# Patient Record
Sex: Female | Born: 1955 | Race: White | Hispanic: No | Marital: Married | Smoking: Former smoker
Health system: Southern US, Community
[De-identification: ages and names within clinical notes are randomized; demographics above are authoritative.]

## PROBLEM LIST (undated history)

## (undated) DIAGNOSIS — A630 Anogenital (venereal) warts: Secondary | ICD-10-CM

## (undated) DIAGNOSIS — K219 Gastro-esophageal reflux disease without esophagitis: Secondary | ICD-10-CM

## (undated) DIAGNOSIS — J449 Chronic obstructive pulmonary disease, unspecified: Secondary | ICD-10-CM

## (undated) DIAGNOSIS — H8109 Meniere's disease, unspecified ear: Secondary | ICD-10-CM

## (undated) DIAGNOSIS — M8580 Other specified disorders of bone density and structure, unspecified site: Secondary | ICD-10-CM

## (undated) DIAGNOSIS — E7800 Pure hypercholesterolemia, unspecified: Secondary | ICD-10-CM

## (undated) DIAGNOSIS — G4700 Insomnia, unspecified: Secondary | ICD-10-CM

## (undated) DIAGNOSIS — K449 Diaphragmatic hernia without obstruction or gangrene: Secondary | ICD-10-CM

## (undated) DIAGNOSIS — F909 Attention-deficit hyperactivity disorder, unspecified type: Secondary | ICD-10-CM

## (undated) DIAGNOSIS — M1990 Unspecified osteoarthritis, unspecified site: Secondary | ICD-10-CM

## (undated) DIAGNOSIS — J439 Emphysema, unspecified: Secondary | ICD-10-CM

## (undated) DIAGNOSIS — R000 Tachycardia, unspecified: Secondary | ICD-10-CM

## (undated) DIAGNOSIS — K2270 Barrett's esophagus without dysplasia: Secondary | ICD-10-CM

## (undated) DIAGNOSIS — H3530 Unspecified macular degeneration: Secondary | ICD-10-CM

## (undated) DIAGNOSIS — N900 Mild vulvar dysplasia: Secondary | ICD-10-CM

## (undated) DIAGNOSIS — D649 Anemia, unspecified: Secondary | ICD-10-CM

## (undated) DIAGNOSIS — R110 Nausea: Secondary | ICD-10-CM

## (undated) DIAGNOSIS — T7840XA Allergy, unspecified, initial encounter: Secondary | ICD-10-CM

## (undated) DIAGNOSIS — D071 Carcinoma in situ of vulva: Secondary | ICD-10-CM

## (undated) HISTORY — DX: Gastro-esophageal reflux disease without esophagitis: K21.9

## (undated) HISTORY — DX: Emphysema, unspecified: J43.9

## (undated) HISTORY — DX: Insomnia, unspecified: G47.00

## (undated) HISTORY — DX: Diaphragmatic hernia without obstruction or gangrene: K44.9

## (undated) HISTORY — DX: Mild vulvar dysplasia: N90.0

## (undated) HISTORY — DX: Meniere's disease, unspecified ear: H81.09

## (undated) HISTORY — DX: Unspecified macular degeneration: H35.30

## (undated) HISTORY — PX: TUBAL LIGATION: SHX77

## (undated) HISTORY — DX: Unspecified osteoarthritis, unspecified site: M19.90

## (undated) HISTORY — DX: Tachycardia, unspecified: R00.0

## (undated) HISTORY — DX: Carcinoma in situ of vulva: D07.1

## (undated) HISTORY — PX: VULVA SURGERY: SHX837

## (undated) HISTORY — PX: BREAST BIOPSY: SHX20

## (undated) HISTORY — PX: OTHER SURGICAL HISTORY: SHX169

## (undated) HISTORY — DX: Other specified disorders of bone density and structure, unspecified site: M85.80

## (undated) HISTORY — DX: Attention-deficit hyperactivity disorder, unspecified type: F90.9

## (undated) HISTORY — PX: UPPER GASTROINTESTINAL ENDOSCOPY: SHX188

## (undated) HISTORY — DX: Barrett's esophagus without dysplasia: K22.70

## (undated) HISTORY — DX: Chronic obstructive pulmonary disease, unspecified: J44.9

## (undated) HISTORY — DX: Pure hypercholesterolemia, unspecified: E78.00

## (undated) HISTORY — DX: Allergy, unspecified, initial encounter: T78.40XA

## (undated) HISTORY — DX: Anemia, unspecified: D64.9

## (undated) HISTORY — DX: Anogenital (venereal) warts: A63.0

---

## 1977-03-27 HISTORY — PX: BREAST EXCISIONAL BIOPSY: SUR124

## 1978-03-27 HISTORY — PX: BREAST EXCISIONAL BIOPSY: SUR124

## 1981-03-27 HISTORY — PX: BREAST EXCISIONAL BIOPSY: SUR124

## 1984-03-27 HISTORY — PX: BREAST EXCISIONAL BIOPSY: SUR124

## 1993-03-27 HISTORY — PX: TOTAL VAGINAL HYSTERECTOMY: SHX2548

## 1998-02-16 ENCOUNTER — Other Ambulatory Visit: Admission: RE | Admit: 1998-02-16 | Discharge: 1998-02-16 | Payer: Self-pay | Admitting: Gynecology

## 1999-02-24 ENCOUNTER — Other Ambulatory Visit: Admission: RE | Admit: 1999-02-24 | Discharge: 1999-02-24 | Payer: Self-pay | Admitting: Gynecology

## 1999-03-28 HISTORY — PX: FOOT SURGERY: SHX648

## 2000-03-05 ENCOUNTER — Other Ambulatory Visit: Admission: RE | Admit: 2000-03-05 | Discharge: 2000-03-05 | Payer: Self-pay | Admitting: Gynecology

## 2001-05-09 ENCOUNTER — Other Ambulatory Visit: Admission: RE | Admit: 2001-05-09 | Discharge: 2001-05-09 | Payer: Self-pay | Admitting: Gynecology

## 2002-05-30 ENCOUNTER — Other Ambulatory Visit: Admission: RE | Admit: 2002-05-30 | Discharge: 2002-05-30 | Payer: Self-pay | Admitting: Gynecology

## 2002-08-22 ENCOUNTER — Encounter (INDEPENDENT_AMBULATORY_CARE_PROVIDER_SITE_OTHER): Payer: Self-pay

## 2002-08-22 ENCOUNTER — Ambulatory Visit (HOSPITAL_COMMUNITY): Admission: RE | Admit: 2002-08-22 | Discharge: 2002-08-22 | Payer: Self-pay | Admitting: Gynecology

## 2003-08-17 ENCOUNTER — Other Ambulatory Visit: Admission: RE | Admit: 2003-08-17 | Discharge: 2003-08-17 | Payer: Self-pay | Admitting: Gynecology

## 2004-08-24 ENCOUNTER — Other Ambulatory Visit: Admission: RE | Admit: 2004-08-24 | Discharge: 2004-08-24 | Payer: Self-pay | Admitting: Gynecology

## 2004-09-02 ENCOUNTER — Ambulatory Visit (HOSPITAL_BASED_OUTPATIENT_CLINIC_OR_DEPARTMENT_OTHER): Admission: RE | Admit: 2004-09-02 | Discharge: 2004-09-02 | Payer: Self-pay | Admitting: Gynecology

## 2004-09-08 ENCOUNTER — Encounter: Admission: RE | Admit: 2004-09-08 | Discharge: 2004-12-07 | Payer: Self-pay | Admitting: Internal Medicine

## 2004-09-26 ENCOUNTER — Encounter: Admission: RE | Admit: 2004-09-26 | Discharge: 2004-09-26 | Payer: Self-pay | Admitting: Gynecology

## 2005-08-15 ENCOUNTER — Ambulatory Visit: Payer: Self-pay | Admitting: Internal Medicine

## 2005-09-11 ENCOUNTER — Other Ambulatory Visit: Admission: RE | Admit: 2005-09-11 | Discharge: 2005-09-11 | Payer: Self-pay | Admitting: Gynecology

## 2005-09-21 ENCOUNTER — Ambulatory Visit: Payer: Self-pay | Admitting: Internal Medicine

## 2005-10-04 ENCOUNTER — Encounter: Admission: RE | Admit: 2005-10-04 | Discharge: 2005-10-04 | Payer: Self-pay | Admitting: Gynecology

## 2005-11-01 ENCOUNTER — Ambulatory Visit (HOSPITAL_COMMUNITY): Admission: RE | Admit: 2005-11-01 | Discharge: 2005-11-02 | Payer: Self-pay | Admitting: Gastroenterology

## 2005-11-01 ENCOUNTER — Encounter (INDEPENDENT_AMBULATORY_CARE_PROVIDER_SITE_OTHER): Payer: Self-pay | Admitting: Specialist

## 2006-09-14 ENCOUNTER — Other Ambulatory Visit: Admission: RE | Admit: 2006-09-14 | Discharge: 2006-09-14 | Payer: Self-pay | Admitting: Gynecology

## 2006-10-08 ENCOUNTER — Encounter: Admission: RE | Admit: 2006-10-08 | Discharge: 2006-10-08 | Payer: Self-pay | Admitting: Gynecology

## 2007-09-23 ENCOUNTER — Other Ambulatory Visit: Admission: RE | Admit: 2007-09-23 | Discharge: 2007-09-23 | Payer: Self-pay | Admitting: Gynecology

## 2007-10-17 ENCOUNTER — Encounter: Admission: RE | Admit: 2007-10-17 | Discharge: 2007-10-17 | Payer: Self-pay | Admitting: Gynecology

## 2007-12-30 ENCOUNTER — Ambulatory Visit: Payer: Self-pay | Admitting: Gynecology

## 2008-05-05 ENCOUNTER — Ambulatory Visit: Payer: Self-pay | Admitting: Gynecology

## 2008-06-29 DIAGNOSIS — J438 Other emphysema: Secondary | ICD-10-CM

## 2008-06-29 DIAGNOSIS — R0602 Shortness of breath: Secondary | ICD-10-CM | POA: Insufficient documentation

## 2008-06-29 DIAGNOSIS — J449 Chronic obstructive pulmonary disease, unspecified: Secondary | ICD-10-CM

## 2008-06-30 ENCOUNTER — Ambulatory Visit: Payer: Self-pay | Admitting: Internal Medicine

## 2008-06-30 DIAGNOSIS — J310 Chronic rhinitis: Secondary | ICD-10-CM | POA: Insufficient documentation

## 2008-07-23 ENCOUNTER — Ambulatory Visit: Payer: Self-pay | Admitting: Internal Medicine

## 2008-08-06 ENCOUNTER — Ambulatory Visit: Payer: Self-pay | Admitting: Internal Medicine

## 2008-10-06 ENCOUNTER — Encounter: Payer: Self-pay | Admitting: Gynecology

## 2008-10-06 ENCOUNTER — Other Ambulatory Visit: Admission: RE | Admit: 2008-10-06 | Discharge: 2008-10-06 | Payer: Self-pay | Admitting: Gynecology

## 2008-10-06 ENCOUNTER — Ambulatory Visit: Payer: Self-pay | Admitting: Gynecology

## 2008-10-19 ENCOUNTER — Ambulatory Visit: Payer: Self-pay | Admitting: Gynecology

## 2008-10-19 ENCOUNTER — Encounter: Admission: RE | Admit: 2008-10-19 | Discharge: 2008-10-19 | Payer: Self-pay | Admitting: Gynecology

## 2009-02-04 ENCOUNTER — Ambulatory Visit: Payer: Self-pay | Admitting: Internal Medicine

## 2009-10-07 ENCOUNTER — Ambulatory Visit: Payer: Self-pay | Admitting: Gynecology

## 2009-10-07 ENCOUNTER — Other Ambulatory Visit: Admission: RE | Admit: 2009-10-07 | Discharge: 2009-10-07 | Payer: Self-pay | Admitting: Gynecology

## 2009-10-26 ENCOUNTER — Encounter: Admission: RE | Admit: 2009-10-26 | Discharge: 2009-10-26 | Payer: Self-pay | Admitting: Gynecology

## 2009-12-07 ENCOUNTER — Encounter: Payer: Self-pay | Admitting: Internal Medicine

## 2009-12-17 HISTORY — PX: CARDIOVASCULAR STRESS TEST: SHX262

## 2009-12-30 ENCOUNTER — Ambulatory Visit: Payer: Self-pay | Admitting: Gynecology

## 2010-02-22 ENCOUNTER — Ambulatory Visit: Payer: Self-pay | Admitting: Internal Medicine

## 2010-04-17 ENCOUNTER — Encounter: Payer: Self-pay | Admitting: Gynecology

## 2010-04-26 NOTE — Assessment & Plan Note (Signed)
Summary: 1 yr f/u ///kp   Visit Type:  Follow-up Primary Provider/Referring Provider:  Keturah Barre  CC:  1 year follow up. pt states breathing has been "fine". Pt c/o occas sob. pt denies any cough. Pt states overall she is fne. .  History of Present Illness: 08/06/08- COPD,,rhinitis Liked Ipratropium nasal spray. Easy fatigue only if bending or lifting. Still some sense that she can't get a comfortable deep breath at times. Denies chest pain except incomplete control of acid reflux on zantac plus kapidex. Throat clearing daily.  Reviewed PFT - Severe COPD by FEV1/FVC 0.49. - 9%, 97%, 100%, 652m CXR- NAD, apical emphysema. Discussed lack of benefit from prior trials of usual lung meds. Did get pneumovax- rash on arm.  February 04, 2009- COPD, rhinitis No worse but still occasionally aware of heavy chest dyspnea. Denies chest pain, palpitation, wheeze or phlegm. 8 years today since she quit smokiing. Throat clearing with acid reflux- Dexilant helps. had flu vax  February 22, 2010- COPD, rhinitis...............Marland Kitchenhusband here 1 year follow up. pt states breathing has been "fine". Pt c/o occas sob. pt denies any cough. Pt states overall she is fne.  Doing well. Really likes ipratropium nasal spray. Dyspnea with exertion hills and stairs and maybe weather.  CXR- We reviewed images. Apical emphysema, NAD.    Preventive Screening-Counseling & Management  Alcohol-Tobacco     Smoking Status: quit     Packs/Day: 1.5     Year Started: Age 51     Year Quit: 2001  Current Medications (verified): 1)  Evista 60 Mg Tabs (Raloxifene Hcl) .... Take 1 By Mouth Once Daily 2)  Vytorin 10-20 Mg Tabs (Ezetimibe-Simvastatin) .... Once Daily 3)  Caltrate 600+d Plus 600-400 Mg-Unit Tabs (Calcium Carbonate-Vit D-Min) .... Take 1 By Mouth Two Times A Day 4)  Magnesium Glycinate  Powd (Magnesium Glycinate) .... 400mg  Two Times A Day 5)  Vitamin D 54098 Unit Caps (Ergocalciferol) .... Take  Monthly 6)  Zyrtec Allergy 10 Mg Tabs (Cetirizine Hcl) .... Take As Directed 7)  Ipratropium Bromide 0.06 % Soln (Ipratropium Bromide) .Marland Kitchen.. 1-2 Sprays Each Nostril Up Yto Three Times A Day As Needed 8)  Vivelle-Dot 0.05 Mg/24hr Pttw (Estradiol) .Marland Kitchen.. 1 Patch Two Times A Week 9)  Dexilant 60 Mg Cpdr (Dexlansoprazole) .... Once Daily 10)  Vyvanse 70 Mg Caps (Lisdexamfetamine Dimesylate) .... Once Daily  Allergies (verified): 1)  ! Morphine  Past History:  Past Surgical History: Last updated: 07/20/2008 Breast bx x 4 Total Abdominal Hysterectomy Hammer toe  Family History: Last updated: 20-Jul-2008 Mother- died CVA@ 29 yo Brother- bullous emphysema  Social History: Last updated: 02/22/2010 Patient states former smoker. quit 2001. 1 1/2 ppd.  Married Astronomer at jail  Risk Factors: Smoking Status: quit (02/22/2010) Packs/Day: 1.5 (02/22/2010)  Past Medical History: C O P D Bronchitis Emphysema Allergic Rhinitis  Social History: Patient states former smoker. quit 2001. 1 1/2 ppd.  Married Astronomer at jail Packs/Day:  1.5  Review of Systems      See HPI       The patient complains of shortness of breath with activity and nasal congestion/difficulty breathing through nose.  The patient denies shortness of breath at rest, coughing up blood, chest pain, irregular heartbeats, acid heartburn, indigestion, loss of appetite, weight change, abdominal pain, difficulty swallowing, sore throat, tooth/dental problems, headaches, sneezing, itching, ear ache, hand/feet swelling, rash, change in color of mucus, and fever.    Vital Signs:  Patient profile:   55 year old female  Height:      66 inches Weight:      142 pounds BMI:     23.00 O2 Sat:      97 % on Room air Pulse rate:   74 / minute BP sitting:   118 / 80  (left arm) Cuff size:   regular  Vitals Entered By: Reynaldo Minium CMA (February 22, 2010 4:04 PM)  O2 Flow:  Room air CC: 1 year follow up. pt states breathing  has been "fine". Pt c/o occas sob. pt denies any cough. Pt states overall she is fne.  Comments meds and allergies updated Reynaldo Minium CMA  February 22, 2010 4:04 PM    Physical Exam  Additional Exam:  General: A/Ox3; pleasant and cooperative, NAD, SKIN: no rash, lesions NODES: no lymphadenopathy HEENT: [place]/AT, EOM- WNL, Conjuctivae- clear, PERRLA, TM-WNL, Nose- clear, Throat- clear and wnl, Mallampati II NECK: Supple w/ fair ROM, JVD- none, normal carotid impulses w/o bruits Thyroid- CHEST: Clear to P&A, quiet, unlabored HEART: RRR, no m/g/r heard ABDOMEN:  WJX:BJYN, nl pulses, no edema, cyanosis or clubbing NEURO: Grossly intact to observation      Impression & Recommendations:  Problem # 1:  C O P D (ICD-496) Stable and not very intrusive. There is little bonchitis so she doesn't notice cough, wheeze or phlegm. I encouraged maintenance of activity to sustain endurance.  We discussed aggravating factors, smoke avoidance, and the role of her rescue inhaler for occasional use.   Problem # 2:  RHINITIS (ICD-472.0) Her nasal spray has worked wll and been well tolerated.  Medications Added to Medication List This Visit: 1)  Vytorin 10-20 Mg Tabs (Ezetimibe-simvastatin) .... Once daily 2)  Dexilant 60 Mg Cpdr (Dexlansoprazole) .... Once daily 3)  Vyvanse 70 Mg Caps (Lisdexamfetamine dimesylate) .... Once daily  Other Orders: Est. Patient Level IV (82956)  Patient Instructions: 1)  Please schedule a follow-up appointment in 1 year. Call sooner if needed. 2)  Script for nasal spray refilled.  Prescriptions: IPRATROPIUM BROMIDE 0.06 % SOLN (IPRATROPIUM BROMIDE) 1-2 sprays each nostril up yto three times a day as needed  #1 x prn   Entered and Authorized by:   Waymon Budge MD   Signed by:   Waymon Budge MD on 02/22/2010   Method used:   Print then Give to Patient   RxID:   [id number]    Immunization History:  Influenza Immunization History:    Influenza:   historical (12/25/2009)

## 2010-04-26 NOTE — Letter (Signed)
Summary: [facility]   Imported By: Sherian Rein 12/31/2009 14:27:13  _____________________________________________________________________  External Attachment:    Type:   Image     Comment:   External Document

## 2010-08-12 NOTE — Op Note (Signed)
Cheyenne Gallegos, BURGOON                      ACCOUNT NO.:  [id number]   MEDICAL RECORD NO.:  [id number]                   PATIENT TYPE:  AMB   LOCATION:  SDC                                  FACILITY:  [facility]   PHYSICIAN:  Juan H. Lily Peer, M.D.             DATE OF BIRTH:  01-21-1956   DATE OF PROCEDURE:  DATE OF DISCHARGE:                                 OPERATIVE REPORT   PREOPERATIVE DIAGNOSIS:  VIN-III.   POSTOPERATIVE DIAGNOSIS:  VIN-III.   PROCEDURE:  1. Colposcopic evaluation of vagina and external genitalia.  2. Wide local excision of left labia majora VIN-III.  Excision of additional     vulvar lesions seen at the time of colposcopic evaluation in the left     upper labia majora.  Excision of vulvar lesion in right lower labia     majora. Excision of vulvar lesion right upper labia majora.   SURGEON:  Juan H. Lily Peer, M.D.   ANESTHESIA:  General endotracheal anesthesia.   INDICATIONS FOR PROCEDURE:  A 55 year old gravida 1, para 1 female who on  routine annual gynecological examination, was noted to have a fleshy lesion  noted in the lower aspect of the left labia majora which was biopsied and  the pathology report had demonstrated severe squamous dysplasia, VIN-III, no  invasion identified, margins involved are high dysplasia.  No other lesion  was noted in the vagina and the vaginal cuff at the time of colposcopic at  that point.   FINDINGS:  After acetic acid was placed in the vagina and external  genitalia, a thorough inspection was done with colposcope and acetowhite  leukoplakic like lesions were noted in addition to the previously biopsied  site from the left labia majora lower aspect, in the upper aspect of the  left labia majora as well as the opposite to the right labia majora upper  and lower __________ encasing lesions were noted to be leukoplakic lesions  as well which were excised as well.  The patient with previous hysterectomy  for dysplasia and  menorrhagia many years ago.  Follow-up Pap smears have  been normal.  The vaginal tube and vaginal cuff with no lesions seen.   DESCRIPTION OF PROCEDURE:  After the patient was adequately counseled, she  was taken to the operating room where she underwent a successful general  endotracheal anesthesia.  She was placed in the lithotomy position.  The  external genitalia and vagina were moistened with acetic acid and a thorough  inspection with a colposcope systematically commencing with the vaginal cuff  which was without any lesions of the remainder of the vaginal tube.  External genitalia was inspected thoroughly noting at this time that is was  evident that besides the area that was biopsied in the office in which the  pathology report came back severe dysplasia, she was noted to have, on the  upper aspect of the left labia majora a flat  leukoplakic lesion and adjacent  on the contralateral labia, there was kissing lesions that were seen on the  right upper labia majora and right labia majora.  The first portion of the  procedure consisted also of applying Lugol solution for adequate demarcation  of these lesions.  The skin was then cleansed and with a marking pen, the  areas were demarcated. The wide local excision was done on the left labia  majora lesion which was previously biopsied and returned back VIN-III with  margins involved so this wide local excision was to go at least 5 mm or  beyond the lesion for complete excision.  This was done with the scalpel and  properly labeled and was admitted for histologic evaluation.  The left labia  majora upper lesion had also been marked and was excised and passed off the  operative field and properly marked as well.  A contralateral labia majora  upper and lower pole lesions were excised with the scalpel as well and  labeled appropriately and submitted for histological evaluation.  These four  excision sites, the skin was reapproximated with  4-0 Vicryl suture, 0.25%  Marcaine was infiltrated for postoperative analgesia.  She received 30 mg of  Toradol en route to the recovery room and Silvadene cream was placed in the  area.  The patient tolerated the procedure well without any complications.  Blood loss was minimal.  Fluid resuscitation was 2200  mL of lactated  Ringers.                                               Juan H. Lily Peer, M.D.    JHF/MEDQ  D:  08/22/2002  T:  08/22/2002  Job:  [id number]

## 2010-08-12 NOTE — Op Note (Signed)
NAMESAMAR, VENNEMAN            ACCOUNT NO.:  [id number]   MEDICAL RECORD NO.:  [id number]          PATIENT TYPE:  AMB   LOCATION:  ENDO                         FACILITY:  [facility]   PHYSICIAN:  Anselmo Rod, M.D.  DATE OF BIRTH:  11-09-1955   DATE OF PROCEDURE:  11/01/2005  DATE OF DISCHARGE:                                 OPERATIVE REPORT   PROCEDURE:  Colonoscopy with cold biopsy x4.   ENDOSCOPIST:  Anselmo Rod, M.D.   INSTRUMENT USED:  Olympus video colonoscope.   INDICATIONS FOR PROCEDURE:  A 55 year old white female undergoing a  screening colonoscopy. The patient has a family history of rectal cancer in  a paternal grandmother, rule out colonic polyps, masses, etc.   PREPROCEDURE PREPARATION:  Informed consent was procured from the patient.  The patient fasted for 4 hours prior to the procedure and after being  prepped with 32 OsmoPrep pills the night of and the morning of the  procedure. The risks and benefits of the procedure including a 10% missed  rate of cancer and polyp were discussed with the patient as well.   PREPROCEDURE PHYSICAL:  The patient had stable vital signs. Neck supple.  Chest clear to auscultation. S1, S2 regular. Abdomen soft with normal bowel  sounds.   DESCRIPTION OF PROCEDURE:  The patient was placed in the left lateral  decubitus position and sedated with 100 mcg of fentanyl and 10 mg of Versed  in slow incremental doses. Once the patient was adequately sedated and  maintained on low flow oxygen and continuous cardiac monitoring, the Olympus  video colonoscope was advanced from the rectum to the cecum. Multiple  washings were done and the appendiceal orifice and ileocecal valve were  clearly visualized and photographed. The terminal ileum appeared healthy and  without lesions. Four small sessile polyps were biopsied from the rectum and  the rectosigmoid colon (cold biopsies x4). The rest of the exam was  unremarkable. Retroflexion  revealed no abnormalities.   IMPRESSION:  1.  Four small sessile polyps biopsied from the rectum and rectosigmoid      colon (two cold biopsies from the rectum and two cold biopsies from the      rectosigmoid colon).  2.  Otherwise normal appearing colon up to the terminal ileum.  3.  No evidence of diverticulosis.   RECOMMENDATIONS:  1.  Await pathology results.  2.  Repeat colonoscopy depending on pathology results.  3.  Avoid all nonsteroidals including aspirin for the next 2 weeks.  4.  Outpatient followup as the need arises in the future.      Anselmo Rod, M.D.  Electronically Signed     JNM/MEDQ  D:  11/01/2005  T:  11/01/2005  Job:  [id number]   cc:   Gaetano Hawthorne. Lily Peer, M.D.  Darlin Priestly, MD

## 2010-08-12 NOTE — Op Note (Signed)
NAMEPARRIE, Cheyenne Gallegos            ACCOUNT NO.:  [id number]   MEDICAL RECORD NO.:  [id number]          PATIENT TYPE:  AMB   LOCATION:  [facility]                         FACILITY:  [facility]   PHYSICIAN:  Juan H. 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 The  right fourchette biopsy had benign squamous mucosa with hyperkeratosis, her  recent PAP smear was normal.   SURGEON:  Juan H. Lily Peer, M.D.   PREOPERATIVE DIAGNOSIS:  VIN I.   POSTOPERATIVE DIAGNOSIS:  VIN I.   PROCEDURE PERFORMED:  CO2 laser ablation of left labia majora/fourchette.   FINDINGS:  As described above.   DESCRIPTION OF OPERATION:  After the patient was adequately counseled, she  was taken to the operating room where she underwent successful general  endotracheal anesthesia. The patient's external genitalia was  painted with  Lugol's solution. The colposcope was brought into view, the area of the left  labia majora, the inferior portion which had been biopsied before showed  slightly decreased uptake of the Lugol solution. The CO2 laser was set at 10  watts on the handheld piece and with a brush like technique, approximately  two millimeters in depth was ablated through the area of the inferior  portion of the left labia majora, the fourchette  and part of the inferior  portion of the right labia minora  in the event that this potentially could  contribute to kissing like lesions. Upon completion,  Silvadene cream was  applied to the area, the patient was extubated, transferred to recovery room  with stable vital signs. Blood loss was minimal, fluid resuscitation  consisted of 400 mL of lactated Ringer's.       JHF/MEDQ  D:  09/02/2004  T:  09/02/2004  Job:  [id number]

## 2010-08-12 NOTE — H&P (Signed)
[name], Cheyenne Gallegos                      ACCOUNT NO.:  [id number]   MEDICAL RECORD NO.:  [id number]                   PATIENT TYPE:  AMB   LOCATION:  SDC                                  FACILITY:  [facility]   PHYSICIAN:  Juan H. Lily Peer, M.D.             DATE OF BIRTH:  Oct 29, 1955   DATE OF ADMISSION:  08/22/2002  DATE OF DISCHARGE:                                HISTORY & PHYSICAL   CHIEF COMPLAINT:  Vulvar intraepithelial neoplasia III.   HISTORY OF PRESENT ILLNESS:  The patient is a 55 year old, gravida 1, para 1  who was seen in the office at [facility] on March 5 for her  annual gynecologic examination. At the time of her examination, there was an  area of the external genitalia near the area of the fourchette a fleshy-like  area and the patient was asked to return to the office for a colposcopic  evaluation. She did so on April 26 and detailed colposcopic evaluation was  performed and the area of the fourchette fleshy-like lesion was excised and  the pathology report demonstrated severe squamous dysplasia VIN III, no  invasion identified, margins involved are high dysplasia. No other lesion  was noted in the vagina or the vaginal cuff. She has had a previous history  of transvaginal hysterectomy in 1995 secondary to dysfunctional uterine  bleeding and dysplasia and her Pap smears have been normal since that time.  Her most Pap smear done here in our office on March 19 was normal. The  patient was scheduled to undergo a wide local excision of the fourchette VIN  III.   PAST MEDICAL HISTORY:  She has diagnosis of Meniere's' disease, she had an  MRI which apparent was negative two years ago. She had been seen by Dr.  Lenard Forth and had been under the care of Dr. Morning, ENT physician in  Hospital. She also has a history of gastroesophageal reflux. She has had a  breast biopsy in the past, benign, transvaginal hysterectomy, cervical cone  biopsy. She also had some  early emphysematous changes when she was a smoker  but apparently she has stopped smoking for several months now. 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   MEDICATIONS:  1. Excedrin migraine p.r.n.  2. Actonel 35 mg q. weekly.  3. Ambien 5 mg q.h.s. p.r.n. and bedtime.  4. Pepcid.  5. Recently Dr. Rene Kocher had seen the patient because of complaint of     restless leg and done an evaluation on her. He had recommended that she     discontinue the Excedrin. She also takes Topamax as a prophylactic agent     for headaches where she takes 25 mg at bedtime and increases by 25 mg per     week as needed up to 100 mg at bedtime. She takes Relafen 750 mg one  twice a day as needed for headaches.  6. Over the counter Prilosec.  7. Darvocet p.r.n..   PHYSICAL EXAMINATION:  VITAL SIGNS:  The patient weighs 148 pounds, 5 feet,  6 inches tall, blood pressure 110/78.  HEENT:  Unremarkable.  NECK:  Supple. Trachea midline. No carotid bruit, no thyromegaly.  LUNGS:  Clear to auscultation without rhonchi or wheezes.  HEART:  Regular rate and rhythm, no murmurs or gallops.  BREASTS:  Unremarkable.  ABDOMEN:  Soft, nontender without rebound or guarding.  PELVIC:  Bartholin, urethra, and Skene glands within normal limits. Vaginal  fourchette fleshy lesion as described as above. Cervix no other gross  lesions seen either colposcopically or grossly.  EXTREMITIES:  DTR 1+, negative clonus, no edema.   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 Is scheduled to undergo on May 28  at 7:30 a.m. at [facility] wide local excision of the vaginal  intraepithelial neoplasia. The risks, pros and cons of the operation were  discussed with the patient to include infection, bleeding, dyspareunia after  a wide local excision break down, infection were discussed. Also in the  event of  the findings on the wide local excision, if there is cancer on the  final pathology report of any worsening lesion then she will need to be  referred to her GYN oncology for possible consideration of a partial  vulvectomy. Will await the pathology report and discuss management course  after the wide local excision which not only could be diagnostic but  therapeutic as well. All of the above were discussed with the patient, all  questions were answered and will following accordingly.   PLAN:  The patient scheduled for wide local excision of vulvar lesion on  Friday, May 28 at 7:30 a.m. at [facility].                                               [place] H. Lily Peer, M.D.    JHF/MEDQ  D:  08/20/2002  T:  08/20/2002  Job:  [id number]

## 2010-08-12 NOTE — H&P (Signed)
NAMELILYIAN, QUAYLE            ACCOUNT NO.:  [id number]   MEDICAL RECORD NO.:  [id number]           PATIENT TYPE:   LOCATION:                                 FACILITY:   PHYSICIAN:  Juan H. Lily Peer, M.D.     DATE OF BIRTH:   DATE OF ADMISSION:  DATE OF DISCHARGE:                                HISTORY & PHYSICAL   The patient is scheduled for surgery Friday, June 9th at 7:30 a.m. at [facility].  Please have history and physical available.   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   HISTORY:  Cheyenne Gallegos is a 55 year old gravida 1, para 1 with a prior history of  total vaginal hysterectomy in 1995 for dysfunctional uterine bleeding.  She  also many years ago before she was a patient of our practice had had vulvar  condyloma which had been treated as an outpatient in another medical  community.  In 2004, the patient had VIN-3 of the left labia majora, for  which she underwent wide local excision, margins were free, and she was  followed closely thereafter.  On July 04, 2004 there was a noted area that  was suspicious and was rebiopsied under colposcopic evaluation,  demonstrating VIN-1 of the  left labia majora.  The right fourchette biopsy  was benign squamous mucosa with hyperkeratosis.  The patient was scheduled  to undergo a CO2 laser ablation of the left labia majora on September 02, 2004 at  [facility].   PAST MEDICAL HISTORY:  She denies any allergies.  She has had one normal  spontaneous vaginal delivery.  She had VIN-3 of the left labia majora in  April, 2004.  She has had past history of vulvar condyloma.  Her most recent  Pap smear in May of 2006 was negative.   MEDICATIONS:  Consist of calcium supplementation, multivitamins, Actonel  which she takes for osteopenia 35 mg q. weekly.   MEDICAL CONDITIONS:  Meniere's disease/vertigo and osteopenia.   PAST SURGICAL HISTORY:  Prior  surgeries have consisted of TVH in 1995 for  DUB, breast biopsies in 1979, 1983, 1980 and 1986, foot surgery in 2001 for  bone spur and bunions, laparoscopic tubal ligation and wide local excision  of left labia majora (VIN-3).   FAMILY HISTORY:  Significant for hypertension.  Brother with COPD and  emphysema.  A family history of osteoporosis, mother, grandmother and aunt,  and the patient's grandmother had rectal cancer.   PHYSICAL EXAMINATION:  GENERAL APPEARANCE:  The patient weighs 144 pounds,  height 5 feet 6 inches tall, blood pressure 122/68.  HEENT:  Unremarkable.  NECK:  Supple.  The trachea is midline.  No carotid bruits, no thyromegaly.  LUNGS:  Clear to auscultation without any rhonchi or wheezes.  HEART:  Regular rate and rhythm.  No murmurs or gallops.  BREASTS:  Both breasts were fully examined in a semisupine position.  They  were symmetrical in appearance.  No skin discoloration.  No nipple  inversion.  No palpable masses or tenderness.  No supraclavicular or  axillary lymphadenopathy.  ABDOMEN:  Soft, nontender without rebound or guarding.  PELVIC:  External genitalia as described above.  Vagina with no gross  lesions.  Vaginal cuff intact.  RECTAL:  Exam unremarkable.   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  The patient has continued to be followed  closely and recently had an area of the left labia majora which was  suspicious for recurrence, and the colposcopic-directed biopsy demonstrated  VIN-1.  The right fourchette biopsy was benign.  The patient is to undergo  CO2 laser ablation of the left labia majora as an outpatient procedure at  [facility].  The risks, benefits and pros and cons were  discussed with the patient.  All questions were answered and will follow  accordingly.   PLAN:  The patient was  scheduled for CO2 laser ablation of vulvar dysplasia  Friday, June 9th at 7:30 a.m. in [facility].  Please have  the history and physical available.       JHF/MEDQ  D:  09/01/2004  T:  09/01/2004  Job:  [id number]

## 2010-08-30 ENCOUNTER — Other Ambulatory Visit (HOSPITAL_COMMUNITY): Payer: Self-pay | Admitting: Gastroenterology

## 2010-08-30 DIAGNOSIS — R110 Nausea: Secondary | ICD-10-CM

## 2010-09-19 ENCOUNTER — Ambulatory Visit (HOSPITAL_COMMUNITY)
Admission: RE | Admit: 2010-09-19 | Discharge: 2010-09-19 | Disposition: A | Discharge status: Home / Self Care | Payer: Managed Care, Other (non HMO) | Source: Ambulatory Visit | Attending: Gastroenterology | Admitting: Gastroenterology

## 2010-09-19 ENCOUNTER — Encounter (HOSPITAL_COMMUNITY): Payer: Self-pay

## 2010-09-19 ENCOUNTER — Encounter (HOSPITAL_COMMUNITY)
Admission: RE | Admit: 2010-09-19 | Discharge: 2010-09-19 | Disposition: A | Discharge status: Home / Self Care | Payer: Managed Care, Other (non HMO) | Source: Ambulatory Visit | Attending: Gastroenterology | Admitting: Gastroenterology

## 2010-09-19 DIAGNOSIS — R1011 Right upper quadrant pain: Secondary | ICD-10-CM | POA: Insufficient documentation

## 2010-09-19 DIAGNOSIS — R0789 Other chest pain: Secondary | ICD-10-CM | POA: Insufficient documentation

## 2010-09-19 DIAGNOSIS — R110 Nausea: Secondary | ICD-10-CM | POA: Insufficient documentation

## 2010-09-19 HISTORY — DX: Nausea: R11.0

## 2010-09-19 MED ORDER — SINCALIDE 5 MCG IJ SOLR: 0.0200 ug/kg | Freq: Once | INTRAMUSCULAR | Status: DC

## 2010-09-19 MED ORDER — TECHNETIUM TC 99M MEBROFENIN IV KIT
5.4000 | PACK | Freq: Once | INTRAVENOUS | Status: AC | PRN
  Administered 2010-09-19: 5.4 via INTRAVENOUS

## 2010-10-10 ENCOUNTER — Encounter: Payer: Self-pay | Admitting: Gynecology

## 2010-10-11 ENCOUNTER — Other Ambulatory Visit: Payer: Self-pay | Admitting: Gynecology

## 2010-10-11 DIAGNOSIS — Z1231 Encounter for screening mammogram for malignant neoplasm of breast: Secondary | ICD-10-CM

## 2010-10-12 ENCOUNTER — Encounter: Payer: Self-pay | Admitting: Gynecology

## 2010-10-13 ENCOUNTER — Encounter (INDEPENDENT_AMBULATORY_CARE_PROVIDER_SITE_OTHER): Payer: Managed Care, Other (non HMO) | Admitting: Gynecology

## 2010-10-13 ENCOUNTER — Other Ambulatory Visit: Payer: Self-pay | Admitting: Gynecology

## 2010-10-13 ENCOUNTER — Other Ambulatory Visit (HOSPITAL_COMMUNITY)
Admission: RE | Admit: 2010-10-13 | Discharge: 2010-10-13 | Disposition: A | Discharge status: Home / Self Care | Payer: Managed Care, Other (non HMO) | Source: Ambulatory Visit | Attending: Gynecology | Admitting: Gynecology

## 2010-10-13 ENCOUNTER — Encounter: Payer: Self-pay | Admitting: Gynecology

## 2010-10-13 DIAGNOSIS — M899 Disorder of bone, unspecified: Secondary | ICD-10-CM

## 2010-10-13 DIAGNOSIS — E559 Vitamin D deficiency, unspecified: Secondary | ICD-10-CM

## 2010-10-13 DIAGNOSIS — Z01419 Encounter for gynecological examination (general) (routine) without abnormal findings: Secondary | ICD-10-CM

## 2010-10-13 DIAGNOSIS — Z1211 Encounter for screening for malignant neoplasm of colon: Secondary | ICD-10-CM

## 2010-10-13 DIAGNOSIS — Z124 Encounter for screening for malignant neoplasm of cervix: Secondary | ICD-10-CM | POA: Insufficient documentation

## 2010-10-13 DIAGNOSIS — Z13220 Encounter for screening for lipoid disorders: Secondary | ICD-10-CM

## 2010-10-13 DIAGNOSIS — Z79890 Hormone replacement therapy (postmenopausal): Secondary | ICD-10-CM

## 2010-10-13 DIAGNOSIS — E7800 Pure hypercholesterolemia, unspecified: Secondary | ICD-10-CM

## 2010-10-28 ENCOUNTER — Ambulatory Visit: Payer: Managed Care, Other (non HMO)

## 2010-11-01 ENCOUNTER — Other Ambulatory Visit: Payer: Self-pay | Admitting: *Deleted

## 2010-11-01 MED ORDER — ERGOCALCIFEROL 1.25 MG (50000 UT) PO CAPS: 50000.0000 [IU] | ORAL_CAPSULE | ORAL | Status: DC

## 2010-11-01 NOTE — Telephone Encounter (Signed)
PHARMACY FAXED OVER RX FOR REFILL. PLEASE SIGN ORIGINAL RX ON CHART AND I WILL FAX TO PHARMACY.

## 2010-11-02 ENCOUNTER — Ambulatory Visit
Admission: RE | Admit: 2010-11-02 | Discharge: 2010-11-02 | Disposition: A | Discharge status: Home / Self Care | Payer: Managed Care, Other (non HMO) | Source: Ambulatory Visit | Attending: Gynecology | Admitting: Gynecology

## 2010-11-02 DIAGNOSIS — Z1231 Encounter for screening mammogram for malignant neoplasm of breast: Secondary | ICD-10-CM

## 2010-11-02 NOTE — Telephone Encounter (Signed)
VITAMIN D2 CAP 5000 I/U FAXED TO [facility].

## 2010-11-03 ENCOUNTER — Other Ambulatory Visit: Payer: Self-pay | Admitting: *Deleted

## 2010-11-03 MED ORDER — ESTRADIOL 0.05 MG/24HR TD PTTW: 1.0000 | MEDICATED_PATCH | TRANSDERMAL | Status: DC

## 2010-11-03 NOTE — Telephone Encounter (Signed)
Faxed refill of Vivelle Dot to [facility] [phone]

## 2011-02-20 ENCOUNTER — Encounter: Payer: Self-pay | Admitting: Internal Medicine

## 2011-02-21 ENCOUNTER — Ambulatory Visit (INDEPENDENT_AMBULATORY_CARE_PROVIDER_SITE_OTHER): Payer: Managed Care, Other (non HMO) | Admitting: Internal Medicine

## 2011-02-21 ENCOUNTER — Encounter: Payer: Self-pay | Admitting: Internal Medicine

## 2011-02-21 VITALS — BP 114/64 | HR 66 | Ht 66.0 in | Wt 145.2 lb

## 2011-02-21 DIAGNOSIS — J449 Chronic obstructive pulmonary disease, unspecified: Secondary | ICD-10-CM

## 2011-02-21 DIAGNOSIS — J310 Chronic rhinitis: Secondary | ICD-10-CM

## 2011-02-21 MED ORDER — ALBUTEROL SULFATE HFA 108 (90 BASE) MCG/ACT IN AERS: 2.0000 | INHALATION_SPRAY | Freq: Four times a day (QID) | RESPIRATORY_TRACT | Status: DC | PRN

## 2011-02-21 MED ORDER — IPRATROPIUM BROMIDE 0.06 % NA SOLN: 2.0000 | Freq: Four times a day (QID) | NASAL | Status: DC

## 2011-02-21 NOTE — Patient Instructions (Addendum)
Sample Patanase for trial- antihistamine       1 or 2 puffs each nostril up to twice daily as needed.   Refill script sent for ipratropium nasal spray  Script printed for rescue inhaler Proair

## 2011-02-21 NOTE — Progress Notes (Signed)
02/21/11- 83 yoF former smoker followed for COPD, rhinitis LOV- 02/22/10 Husband here. Had flu shot. Since last here has had a few episodes of shortness of breath, felt as difficulty taking in her breath, without wheeze or cough. Husband's rescue inhaler has helped with the use. This feeling is more common in summer and fall but not positional. She occasionally wakes at night with throat tickle but this shortness of breath sensation is also seen in the daytime and that work. 4 rhinitis, ipratropium nasal spray has been good but it doesn't seem to be lasting as long as it did. Mostly she has bothersome rhinorrhea without a lot of sneeze or congestion. Today she feels "fine".  ROS-see HPI Constitutional:   No-   weight loss, night sweats, fevers, chills, fatigue, lassitude. HEENT:   No-  headaches, difficulty swallowing, tooth/dental problems, sore throat,       No-  sneezing, itching, ear ache, nasal congestion,+ post nasal drip,  CV:  No-   chest pain, orthopnea, PND, swelling in lower extremities, anasarca,                                  dizziness, palpitations Resp: Per HPI shortness of breath with exertion or at rest.              No-   productive cough,  No non-productive cough,  No- coughing up of blood.              No-   change in color of mucus.  No- wheezing.   Skin: No-   rash or lesions. GI:  No-   heartburn, indigestion, abdominal pain, nausea, vomiting, diarrhea,                 change in bowel habits, loss of appetite GU: No-   dysuria, change in color of urine, no urgency or frequency.  No- flank pain. MS:  No-   joint pain or swelling.  No- decreased range of motion.  No- back pain. Neuro-     nothing unusual Psych:  No- change in mood or affect. No depression or anxiety.  No memory loss.  OBJ General- Alert, Oriented, Affect-appropriate, Distress- none acute; well-appearing Skin- rash-none, lesions- none, excoriation- none Lymphadenopathy- none Head- atraumatic  Eyes- Gross vision intact, PERRLA, conjunctivae clear secretions            Ears- Hearing, canals-normal            Nose- Clear, no-Septal dev, mucus, polyps, erosion, perforation             Throat- Mallampati II , mucosa clear , drainage- none, tonsils- atrophic Neck- flexible , trachea midline, no stridor , thyroid nl, carotid no bruit Chest - symmetrical excursion , unlabored           Heart/CV- RRR , no murmur , no gallop  , no rub, nl s1 s2                           - JVD- none , edema- none, stasis changes- none, varices- none           Lung- clear to P&A, wheeze- none, cough- none , dullness-none, rub- none           Chest wall-  Abd- tender-no, distended-no, bowel sounds-present, HSM- no Br/ Gen/ Rectal- Not done, not indicated Extrem- cyanosis- none, clubbing,  none, atrophy- none, strength- nl Neuro- grossly intact to observation

## 2011-02-22 NOTE — Assessment & Plan Note (Signed)
She mainly complains of a watery rhinorrhea in summer and fall. Ipratropium isn't quite enough. Plan-try antihistamine nasal spray

## 2011-02-22 NOTE — Assessment & Plan Note (Signed)
Mild episodic dyspnea not particularly related to time of day or position but probably associated more with exertion. Since her husband's rescue inhaler has helped, we will let her have one of her own. Medication education was done.

## 2011-04-09 ENCOUNTER — Other Ambulatory Visit: Payer: Self-pay | Admitting: Gynecology

## 2011-09-30 ENCOUNTER — Encounter (HOSPITAL_COMMUNITY): Payer: Self-pay | Admitting: Emergency Medicine

## 2011-09-30 ENCOUNTER — Emergency Department (HOSPITAL_COMMUNITY)
Admission: EM | Admit: 2011-09-30 | Discharge: 2011-09-30 | Disposition: A | Discharge status: Home / Self Care | Payer: Managed Care, Other (non HMO) | Attending: Emergency Medicine | Admitting: Emergency Medicine

## 2011-09-30 ENCOUNTER — Emergency Department (HOSPITAL_COMMUNITY): Payer: Managed Care, Other (non HMO)

## 2011-09-30 DIAGNOSIS — R110 Nausea: Secondary | ICD-10-CM | POA: Insufficient documentation

## 2011-09-30 DIAGNOSIS — R630 Anorexia: Secondary | ICD-10-CM | POA: Insufficient documentation

## 2011-09-30 DIAGNOSIS — R5381 Other malaise: Secondary | ICD-10-CM | POA: Insufficient documentation

## 2011-09-30 DIAGNOSIS — E7800 Pure hypercholesterolemia, unspecified: Secondary | ICD-10-CM | POA: Insufficient documentation

## 2011-09-30 DIAGNOSIS — R197 Diarrhea, unspecified: Secondary | ICD-10-CM

## 2011-09-30 DIAGNOSIS — R12 Heartburn: Secondary | ICD-10-CM | POA: Insufficient documentation

## 2011-09-30 DIAGNOSIS — K219 Gastro-esophageal reflux disease without esophagitis: Secondary | ICD-10-CM | POA: Insufficient documentation

## 2011-09-30 DIAGNOSIS — IMO0001 Reserved for inherently not codable concepts without codable children: Secondary | ICD-10-CM | POA: Insufficient documentation

## 2011-09-30 DIAGNOSIS — R1032 Left lower quadrant pain: Secondary | ICD-10-CM | POA: Insufficient documentation

## 2011-09-30 DIAGNOSIS — R231 Pallor: Secondary | ICD-10-CM | POA: Insufficient documentation

## 2011-09-30 DIAGNOSIS — Z79899 Other long term (current) drug therapy: Secondary | ICD-10-CM | POA: Insufficient documentation

## 2011-09-30 DIAGNOSIS — K529 Noninfective gastroenteritis and colitis, unspecified: Secondary | ICD-10-CM

## 2011-09-30 DIAGNOSIS — K5289 Other specified noninfective gastroenteritis and colitis: Secondary | ICD-10-CM | POA: Insufficient documentation

## 2011-09-30 DIAGNOSIS — F909 Attention-deficit hyperactivity disorder, unspecified type: Secondary | ICD-10-CM | POA: Insufficient documentation

## 2011-09-30 DIAGNOSIS — K2270 Barrett's esophagus without dysplasia: Secondary | ICD-10-CM | POA: Insufficient documentation

## 2011-09-30 LAB — CBC WITH DIFFERENTIAL/PLATELET
Basophils Absolute: 0 10*3/uL (ref 0.0–0.1)
Eosinophils Relative: 0 % (ref 0–5)
HCT: 36.4 % (ref 36.0–46.0)
Hemoglobin: 12.7 g/dL (ref 12.0–15.0)
Lymphocytes Relative: 12 % (ref 12–46)
Monocytes Relative: 24 % — ABNORMAL HIGH (ref 3–12)
Neutro Abs: 2.4 10*3/uL (ref 1.7–7.7)
RBC: 4.13 MIL/uL (ref 3.87–5.11)
RDW: 12.1 % (ref 11.5–15.5)
WBC: 3.8 10*3/uL — ABNORMAL LOW (ref 4.0–10.5)

## 2011-09-30 LAB — COMPREHENSIVE METABOLIC PANEL
ALT: 21 U/L (ref 0–35)
Calcium: 8.5 mg/dL (ref 8.4–10.5)
Creatinine, Ser: 0.78 mg/dL (ref 0.50–1.10)
GFR calc Af Amer: >90 mL/min (ref >90)
Glucose, Bld: 112 mg/dL — ABNORMAL HIGH (ref 70–99)
Sodium: 129 mEq/L — ABNORMAL LOW (ref 135–145)
Total Protein: 6.7 g/dL (ref 6.0–8.3)

## 2011-09-30 MED ORDER — OXYCODONE-ACETAMINOPHEN 5-325 MG PO TABS: 1.0000 | ORAL_TABLET | ORAL | Status: AC | PRN

## 2011-09-30 MED ORDER — SODIUM CHLORIDE 0.9 % IV BOLUS (SEPSIS)
1000.0000 mL | Freq: Once | INTRAVENOUS | Status: AC
  Administered 2011-09-30: 1000 mL via INTRAVENOUS

## 2011-09-30 MED ORDER — METRONIDAZOLE 500 MG PO TABS: 500.0000 mg | ORAL_TABLET | Freq: Two times a day (BID) | ORAL | Status: AC

## 2011-09-30 MED ORDER — HYDROMORPHONE HCL PF 1 MG/ML IJ SOLN
1.0000 mg | Freq: Once | INTRAMUSCULAR | Status: AC
  Administered 2011-09-30: 1 mg via INTRAVENOUS
  Filled 2011-09-30: qty 1

## 2011-09-30 MED ORDER — IOHEXOL 300 MG/ML  SOLN
100.0000 mL | Freq: Once | INTRAMUSCULAR | Status: AC | PRN
  Administered 2011-09-30: 100 mL via INTRAVENOUS

## 2011-09-30 MED ORDER — IOHEXOL 300 MG/ML  SOLN
20.0000 mL | INTRAMUSCULAR | Status: DC
  Administered 2011-09-30: 20 mL via ORAL

## 2011-09-30 MED ORDER — CIPROFLOXACIN HCL 500 MG PO TABS: 500.0000 mg | ORAL_TABLET | Freq: Two times a day (BID) | ORAL | Status: AC

## 2011-09-30 MED ORDER — ONDANSETRON HCL 4 MG PO TABS: 4.0000 mg | ORAL_TABLET | Freq: Four times a day (QID) | ORAL | Status: AC | PRN

## 2011-09-30 NOTE — ED Provider Notes (Signed)
I saw and evaluated the patient, reviewed the resident's note and I agree with the findings and plan.   Lyanne Co, MD 09/30/11 [phone]

## 2011-09-30 NOTE — ED Provider Notes (Signed)
History     CSN: [id number]  Arrival date & time 09/30/11  1450   First MD Initiated Contact with Patient 09/30/11 1832      Chief Complaint  Patient presents with  . Diarrhea  . Abdominal Pain  . Nausea    (Consider location/radiation/quality/duration/timing/severity/associated sxs/prior treatment) Patient is a 56 y.o. female presenting with abdominal pain. The history is provided by the patient.  Abdominal Pain The primary symptoms of the illness include abdominal pain, fatigue, nausea and diarrhea. The primary symptoms of the illness do not include fever, shortness of breath, vomiting or dysuria. The current episode started more than 2 days ago. The onset of the illness was gradual. The problem has been gradually worsening.  The abdominal pain is located in the LLQ. The abdominal pain does not radiate. Pain scale: moderate.  The diarrhea began 3 to 5 days ago. The diarrhea is watery. The diarrhea occurs more than 10 times per day.  The patient states that she believes she is currently not pregnant. The patient has had a change in bowel habit. Additional symptoms associated with the illness include anorexia and heartburn. Symptoms associated with the illness do not include chills, constipation, frequency or back pain. Significant associated medical issues include GERD (Barrett's esophagus).    Past Medical History  Diagnosis Date  . Nausea   . Normal spontaneous vaginal delivery   . VIN III (vulvar intraepithelial neoplasia III)   . Condyloma     vulvar  . 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 Osteopenia   . GERD (gastroesophageal reflux disease)   . Barretts esophagus   . Insomnia   . ADD (attention deficit disorder with hyperactivity)   . Vitamin d deficiency   . Hypercholesteremia     Past Surgical History  Procedure Date  . Total vaginal hysterectomy 1995  . Foot surgery 2001  . Tubal ligation   . Vulva surgery   . Co2 laser of labia   .  Breast biopsy [phone]    Family History  Problem Relation Age of Onset  . Hypertension Mother   . Diabetes Mother   . Heart disease Mother   . COPD Brother   . Colon cancer Paternal Grandmother     History  Substance Use Topics  . Smoking status: Former Smoker -- 1.5 packs/day    Types: Cigarettes    Quit date: 03/28/1999  . Smokeless tobacco: Not on file  . Alcohol Use: Yes    OB History    Grav Para Term Preterm Abortions TAB SAB Ect Mult Living                  Review of Systems  Constitutional: Positive for fatigue. Negative for fever and chills.  Respiratory: Negative for cough and shortness of breath.   Cardiovascular: Negative for chest pain and palpitations.  Gastrointestinal: Positive for heartburn, nausea, abdominal pain, diarrhea and anorexia. Negative for vomiting, constipation and blood in stool.  Genitourinary: Negative for dysuria and frequency.  Musculoskeletal: Positive for myalgias. Negative for back pain.  Skin: Positive for pallor. Negative for color change and rash.  Neurological: Negative for light-headedness and headaches.  All other systems reviewed and are negative.    Allergies  Morphine  Home Medications   Current Outpatient Rx  Name Route Sig Dispense Refill  . ZYRTEC PO Oral Take 1 tablet by mouth daily.     . DEXLANSOPRAZOLE 60 MG PO CPDR Oral Take 60  mg by mouth daily.      . ERGOCALCIFEROL 50000 UNITS PO CAPS Oral Take 50,000 Units by mouth every 30 (thirty) days. Due 10-04-11    . ESTRADIOL 0.05 MG/24HR TD PTTW Transdermal Place 1 patch (0.05 mg total) onto the skin 2 (two) times a week. 24 patch 4  . HYDROCODONE-ACETAMINOPHEN 5-500 MG PO TABS Oral Take 1 tablet by mouth every 4 (four) hours as needed. For pain    . IPRATROPIUM BROMIDE 0.06 % NA SOLN Nasal Place 2 sprays into the nose 4 (four) times daily. 1-2 sprays in each nostril daily as needed 15 mL prn  . LISDEXAMFETAMINE DIMESYLATE 70 MG PO CAPS Oral Take 70 mg by mouth  every morning.      Marland Kitchen LOPERAMIDE HCL 2 MG PO CAPS Oral Take 2 mg by mouth every 2 (two) hours while awake. diarrhea    . ONDANSETRON HCL 4 MG PO TABS Oral Take 4 mg by mouth every 8 (eight) hours as needed. nausea    . RALOXIFENE HCL 60 MG PO TABS Oral Take 60 mg by mouth daily.      Marland Kitchen ZOLPIDEM TARTRATE 5 MG PO TABS Oral Take 5 mg by mouth at bedtime as needed. sleep      BP 99/65  Pulse 87  Temp 99.2 F (37.3 C) (Oral)  Resp 20  SpO2 97%  Physical Exam  Nursing note and vitals reviewed. Constitutional: She is oriented to person, place, and time. She appears well-developed and well-nourished.  HENT:  Head: Normocephalic and atraumatic.  Eyes: Pupils are equal, round, and reactive to light.  Cardiovascular: Normal rate, regular rhythm, normal heart sounds and intact distal pulses.   Pulmonary/Chest: Effort normal and breath sounds normal. No respiratory distress.  Abdominal: Soft. Bowel sounds are normal. She exhibits no distension. There is tenderness (moderate, LLQ). There is no rebound.  Neurological: She is alert and oriented to person, place, and time.  Skin: Skin is warm and dry.  Psychiatric: She has a normal mood and affect.    ED Course  Procedures (including critical care time)  Labs Reviewed  CBC WITH DIFFERENTIAL - Abnormal; Notable for the following:    WBC 3.8 (*)     Monocytes Relative 24 (*)     Lymphs Abs 0.5 (*)     All other components within normal limits  COMPREHENSIVE METABOLIC PANEL - Abnormal; Notable for the following:    Sodium 129 (*)     Potassium 3.2 (*)     Chloride 91 (*)     Glucose, Bld 112 (*)     Albumin 3.1 (*)     All other components within normal limits  URINALYSIS, ROUTINE W REFLEX MICROSCOPIC   Ct Abdomen Pelvis W Contrast  09/30/2011  *RADIOLOGY REPORT*  Clinical Data: Abdominal pain.  Diarrhea.  CT ABDOMEN AND PELVIS WITH CONTRAST  Technique:  Multidetector CT imaging of the abdomen and pelvis was performed following the standard  protocol during bolus administration of intravenous contrast.  Contrast: OMNIPAQUE IOHEXOL 300 MG/ML  SOLN  Comparison: Radiography 09/28/2011  Findings: There is atelectasis or scarring in the right middle lobe and in the right lower lobe.  No pleural or pericardial fluid.  The liver has a normal appearance without focal lesions or biliary ductal dilatation. Minimal focal fat adjacent to the falciform ligament.  No calcified gallstones.  The spleen is normal.  The pancreas is normal.  The adrenal glands are normal.  The kidneys are normal.  The aorta shows atherosclerotic change but no aneurysm.  The IVC is normal.  There is free intraperitoneal fluid.  No intraperitoneal air is identified.  There is colonic wall thickening affecting the entire colon.  Small bowel appears normal.  No bony abnormality.  IMPRESSION: Diffuse colonic wall thickening consistent with colitis.  Small amount of free intraperitoneal fluid, presumably reactive.  Micro perforation cannot be excluded, but there is no evidence of free air or free contrast. The colitis could be infectious or indicate inflammatory bowel disease.  Original Report Authenticated By: Thomasenia Sales, M.D.     1. Colitis   2. Diarrhea       MDM  This is a 56 year old female who presents here today with left lower quadrant abdominal pain and diarrhea from her PCPs office. The patient states that on 6:30 he began using Essaic tonic which is an "herbal deep cleanser." She states that on 7/2 in the evening she began having some loose stools, so stopped the use of the tonic on the morning of 7/3. She states that since then she had worsening diarrhea greater than 20 bowel movements a day, without any improvement by using Imodium. She has also had progressive development of left lower artery abdominal pain over this period of time. She says she has been drinking significant amounts of earache to rehydrate herself, however this has irritated her reflux and  Barrett esophagus, making her slightly nauseous. She does endorse low grade fevers at home up to 100. She was seen at an outside hospital on the evening of 7/4, where lab work and an x-ray were done, she was told to use Imodium at this point in time. She followed up with her regular doctor today due to worsening pain, and her description it sounds like she had some rebound on exam in her left lower artery, which prompted her physician sent her here for further evaluation. At this point in time she does have moderate tenderness to the left lower quadrant, but no signs of rebound or guarding at this point in time. We'll check basic labs, as well as a CT scan to evaluate for possible colitis given her pain and persistent diarrhea, as well as other intra-abdominal pathology.  The patient's CT scan does show evidence of colitis. Her labs do show signs of mild dehydration. The patient does feel better after fluids. Discussed with the patient the findings on the CT scan, the treatment, followup with her regular doctor, in GI followup once this has resolved her possible colonoscopy, as well as indications to return to the emergency department. The patient and her husband express understanding.  Theotis Burrow, MD 09/30/11 [phone]

## 2011-09-30 NOTE — ED Notes (Signed)
Pt reports started taking over the counter Essiac Tonic last Saturday. By Tuesday pt began to experience loose stools by Wednesday night pt had diarrhea. Pt seen at [facility] on Thursday night for same. But not feeling any better. Pt seen by PMD today and had lab work done. PMD sent pt here for further eval due to pt have abdominal pain with tenderness to left lower quadrant and pale skin. Pt also reports nausea.

## 2011-09-30 NOTE — ED Notes (Signed)
Pt not in room.

## 2011-10-09 ENCOUNTER — Other Ambulatory Visit: Payer: Self-pay | Admitting: Gynecology

## 2011-10-09 DIAGNOSIS — Z1231 Encounter for screening mammogram for malignant neoplasm of breast: Secondary | ICD-10-CM

## 2011-10-16 ENCOUNTER — Encounter: Payer: Self-pay | Admitting: Gynecology

## 2011-10-16 ENCOUNTER — Other Ambulatory Visit: Payer: Self-pay | Admitting: Gynecology

## 2011-10-16 ENCOUNTER — Other Ambulatory Visit: Payer: Self-pay | Admitting: *Deleted

## 2011-10-16 ENCOUNTER — Encounter: Payer: Self-pay | Admitting: *Deleted

## 2011-10-16 ENCOUNTER — Other Ambulatory Visit (HOSPITAL_COMMUNITY)
Admission: RE | Admit: 2011-10-16 | Discharge: 2011-10-16 | Disposition: A | Discharge status: Home / Self Care | Payer: Managed Care, Other (non HMO) | Source: Ambulatory Visit | Attending: Gynecology | Admitting: Gynecology

## 2011-10-16 ENCOUNTER — Ambulatory Visit (INDEPENDENT_AMBULATORY_CARE_PROVIDER_SITE_OTHER): Payer: Managed Care, Other (non HMO) | Admitting: Gynecology

## 2011-10-16 VITALS — BP 104/66 | Ht 66.25 in | Wt 136.0 lb

## 2011-10-16 DIAGNOSIS — Z01419 Encounter for gynecological examination (general) (routine) without abnormal findings: Secondary | ICD-10-CM

## 2011-10-16 DIAGNOSIS — N63 Unspecified lump in unspecified breast: Secondary | ICD-10-CM

## 2011-10-16 DIAGNOSIS — Z1151 Encounter for screening for human papillomavirus (HPV): Secondary | ICD-10-CM | POA: Insufficient documentation

## 2011-10-16 DIAGNOSIS — N900 Mild vulvar dysplasia: Secondary | ICD-10-CM | POA: Insufficient documentation

## 2011-10-16 DIAGNOSIS — Z780 Asymptomatic menopausal state: Secondary | ICD-10-CM | POA: Insufficient documentation

## 2011-10-16 DIAGNOSIS — E7800 Pure hypercholesterolemia, unspecified: Secondary | ICD-10-CM | POA: Insufficient documentation

## 2011-10-16 DIAGNOSIS — M8580 Other specified disorders of bone density and structure, unspecified site: Secondary | ICD-10-CM | POA: Insufficient documentation

## 2011-10-16 DIAGNOSIS — D071 Carcinoma in situ of vulva: Secondary | ICD-10-CM | POA: Insufficient documentation

## 2011-10-16 DIAGNOSIS — I10 Essential (primary) hypertension: Secondary | ICD-10-CM

## 2011-10-16 DIAGNOSIS — K2270 Barrett's esophagus without dysplasia: Secondary | ICD-10-CM | POA: Insufficient documentation

## 2011-10-16 DIAGNOSIS — Z8639 Personal history of other endocrine, nutritional and metabolic disease: Secondary | ICD-10-CM

## 2011-10-16 DIAGNOSIS — Z87898 Personal history of other specified conditions: Secondary | ICD-10-CM

## 2011-10-16 DIAGNOSIS — M899 Disorder of bone, unspecified: Secondary | ICD-10-CM

## 2011-10-16 DIAGNOSIS — N898 Other specified noninflammatory disorders of vagina: Secondary | ICD-10-CM

## 2011-10-16 DIAGNOSIS — R634 Abnormal weight loss: Secondary | ICD-10-CM

## 2011-10-16 LAB — COMPREHENSIVE METABOLIC PANEL
Albumin: 3.8 g/dL (ref 3.5–5.2)
BUN: 9 mg/dL (ref 6–23)
CO2: 27 mEq/L (ref 19–32)
Calcium: 9 mg/dL (ref 8.4–10.5)
Chloride: 106 mEq/L (ref 96–112)
Glucose, Bld: 80 mg/dL (ref 70–99)
Potassium: 4 mEq/L (ref 3.5–5.3)
Sodium: 139 mEq/L (ref 135–145)
Total Protein: 6 g/dL (ref 6.0–8.3)

## 2011-10-16 LAB — CBC WITH DIFFERENTIAL/PLATELET
Basophils Relative: 1 % (ref 0–1)
HCT: 36.3 % (ref 36.0–46.0)
Hemoglobin: 11.6 g/dL — ABNORMAL LOW (ref 12.0–15.0)
Lymphocytes Relative: 32 % (ref 12–46)
Lymphs Abs: 1.3 10*3/uL (ref 0.7–4.0)
MCHC: 32 g/dL (ref 30.0–36.0)
Monocytes Absolute: 0.4 10*3/uL (ref 0.1–1.0)
Monocytes Relative: 9 % (ref 3–12)
Neutro Abs: 2.2 10*3/uL (ref 1.7–7.7)
RBC: 3.86 MIL/uL — ABNORMAL LOW (ref 3.87–5.11)

## 2011-10-16 LAB — LIPID PANEL: Cholesterol: 157 mg/dL (ref 0–200)

## 2011-10-16 LAB — TSH: TSH: 1.225 u[IU]/mL (ref 0.350–4.500)

## 2011-10-16 MED ORDER — RALOXIFENE HCL 60 MG PO TABS: 60.0000 mg | ORAL_TABLET | Freq: Every day | ORAL | Status: DC

## 2011-10-16 MED ORDER — ESTRADIOL 0.05 MG/24HR TD PTTW: 1.0000 | MEDICATED_PATCH | TRANSDERMAL | Status: DC

## 2011-10-16 MED ORDER — ERGOCALCIFEROL 1.25 MG (50000 UT) PO CAPS: 50000.0000 [IU] | ORAL_CAPSULE | ORAL | Status: DC

## 2011-10-16 MED ORDER — ZOLPIDEM TARTRATE 5 MG PO TABS: 5.0000 mg | ORAL_TABLET | Freq: Every evening | ORAL | Status: DC | PRN

## 2011-10-16 NOTE — Patient Instructions (Signed)

## 2011-10-16 NOTE — Progress Notes (Signed)
Cheyenne Gallegos October 14, 1955 [id number]   History:    56 y.o.  for annual gyn exam with multiple complaints today. She recently noticed a lump on her breast and was also concern of a possible lesion on her vulva. Patient last mammogram was in 2012 which was normal. She has had several breast biopsies on the right breast in 1979, 1980, 1983 and 1986 all benign. Her last colonoscopy was in 2008. Review of her record indicated she had severe dysplasia and LEEP cervical conization several years ago. In 1995 she had a transvaginal hysterectomy for dysfunction uterine bleeding and for dysplasia. 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 She has had history of Barrett's esophagitis and has been followed by Dr. Loreta Ave gastroenterologist. She's also suffer time for insomnia.  Past medical history,surgical history, family history and social history were all reviewed and documented in the EPIC chart.  Gynecologic History No LMP recorded. Patient has had a hysterectomy. Contraception: Hysterectomy Last Pap: 2012. Results were: normal Last mammogram: 2012. Results were: normal  Obstetric History OB History    Grav Para Term Preterm Abortions TAB SAB Ect Mult Living   1 1 1       1      # Outc Date GA Lbr Len/2nd Wgt Sex Del Anes PTL Lv   1 TRM     M SVD  No Yes       ROS: A ROS was performed and pertinent positives and negatives are included in the history.  GENERAL: No fevers or chills. HEENT: No change in vision, no earache, sore throat or sinus congestion. NECK: No pain or stiffness. CARDIOVASCULAR: No chest pain or pressure. No palpitations. PULMONARY: No shortness of breath, cough or wheeze. GASTROINTESTINAL: No abdominal pain, nausea, vomiting or diarrhea, melena or bright red blood per  rectum. GENITOURINARY: No urinary frequency, urgency, hesitancy or dysuria. MUSCULOSKELETAL: No joint or muscle pain, no back pain, no recent trauma. DERMATOLOGIC: No rash, no itching, no lesions. ENDOCRINE: No polyuria, polydipsia, no heat or cold intolerance. No recent change in weight. HEMATOLOGICAL: No anemia or easy bruising or bleeding. NEUROLOGIC: No headache, seizures, numbness, tingling or weakness. PSYCHIATRIC: No depression, no loss of interest in normal activity or change in sleep pattern.     Exam: chaperone present  BP 104/66  Ht 5' 6.25" (1.683 m)  Wt 136 lb (61.689 kg)  BMI 21.79 kg/m2  Body mass index is 21.79 kg/(m^2).  General appearance : Well developed well nourished female. No acute distress HEENT: Neck supple, trachea midline, no carotid bruits, no thyroidmegaly Lungs: Clear to auscultation, no rhonchi or wheezes, or rib retractions  Heart: Regular rate and rhythm, no murmurs or gallops  Breast: Right breast 10:00 position periareolar region there were 2 lesions measuring approximately 1 cm in size and tender. There was no supraclavicular axillary lymphadenopathy on either breast. Contralateral breast no palpable masses or tenderness.    Abdomen: no palpable masses or tenderness, no rebound or guarding Extremities: no edema or skin discoloration or tenderness  Pelvic:  Bartholin, Urethra, Skene Glands: Within normal limits             Vagina: No gross lesions or discharge  Cervix: Absent Uterus  absent  Adnexa  Without masses or tenderness  Anus and  perineum  normal   Rectovaginal  normal sphincter tone without palpated masses or tenderness             Hemoccult cards provided     Assessment/Plan:  56 y.o. female for annual exam was seen in the hospital recently for colitis. She has recovered already. She did lose some weight. Patient has had in the past history vitamin D deficiency for which she takes vitamin D.2 50,000 units q. monthly. For vasomotor  symptoms she has been on Vivelle-Dot 0.05 patch twice a week. For her osteopenia she takes Evista 60 mg daily. For insomnia she takes Ambien 10 mg by mouth when necessary. The following labs were drawn today: CBC, fasting lipid profile, TSH, vitamin D, and comprehensive metabolic panel. New Pap smear screening guidelines discussed. Since patient has had history of severe cervical dysplasia in the past and vulvar dysplasia she will continue to receive annual Pap smears. Patient will be referred to the radiologist for diagnostic mammogram of the right breast mass that patient recently discovered and was confirmed on exam today. Patient also will continue to followup with her gastroenterologist for her Barrett's esophagitis. She was reminded also to followup with her internist who will have access to her labs drawn today. She was reminded importance of calcium as well as weightbearing exercises for osteoporosis prevention. She will also scheduled for overdue bone density study.    Ok Edwards MD, 10:32 AM 10/16/2011

## 2011-10-17 ENCOUNTER — Other Ambulatory Visit: Payer: Self-pay | Admitting: *Deleted

## 2011-10-17 ENCOUNTER — Encounter: Payer: Self-pay | Admitting: *Deleted

## 2011-10-17 DIAGNOSIS — D071 Carcinoma in situ of vulva: Secondary | ICD-10-CM

## 2011-10-17 LAB — URINALYSIS W MICROSCOPIC + REFLEX CULTURE
Bilirubin Urine: NEGATIVE
Crystals: NONE SEEN
Glucose, UA: NEGATIVE mg/dL
Leukocytes, UA: NEGATIVE
Protein, ur: NEGATIVE mg/dL
Specific Gravity, Urine: 1.015 (ref 1.005–1.030)
Urobilinogen, UA: 0.2 mg/dL (ref 0.0–1.0)

## 2011-10-17 LAB — VITAMIN D 25 HYDROXY (VIT D DEFICIENCY, FRACTURES): Vit D, 25-Hydroxy: 27 ng/mL — ABNORMAL LOW (ref 30–89)

## 2011-10-17 MED ORDER — RALOXIFENE HCL 60 MG PO TABS: 60.0000 mg | ORAL_TABLET | Freq: Every day | ORAL | Status: DC

## 2011-10-17 MED ORDER — RALOXIFENE HCL 60 MG PO TABS: 60.0000 mg | ORAL_TABLET | Freq: Every day | ORAL | Status: AC

## 2011-10-17 NOTE — Progress Notes (Signed)
Patient ID: Cheyenne Gallegos, female   DOB: December 01, 1955, 56 y.o.   MRN: [id number] Spoke with casey at [facility] center regarding pt appointment for diag. Mammogram due to 2 small nodules in periareolar area 10 o'clock right breast. They will contact pt and set appointment.

## 2011-10-19 NOTE — Progress Notes (Signed)
Patient ID: Cheyenne Gallegos, female   DOB: 07/21/1955, 56 y.o.   MRN: [id number] Breast center appointment for diag. Mammogram made on 10/24/11

## 2011-10-24 ENCOUNTER — Ambulatory Visit
Admission: RE | Admit: 2011-10-24 | Discharge: 2011-10-24 | Disposition: A | Discharge status: Home / Self Care | Payer: Managed Care, Other (non HMO) | Source: Ambulatory Visit | Attending: Gynecology | Admitting: Gynecology

## 2011-10-24 ENCOUNTER — Other Ambulatory Visit: Payer: Self-pay | Admitting: Gynecology

## 2011-10-24 DIAGNOSIS — N63 Unspecified lump in unspecified breast: Secondary | ICD-10-CM

## 2011-10-24 DIAGNOSIS — Z780 Asymptomatic menopausal state: Secondary | ICD-10-CM

## 2011-11-07 ENCOUNTER — Ambulatory Visit
Admission: RE | Admit: 2011-11-07 | Discharge: 2011-11-07 | Disposition: A | Discharge status: Home / Self Care | Payer: Managed Care, Other (non HMO) | Source: Ambulatory Visit | Attending: Gynecology | Admitting: Gynecology

## 2011-11-07 DIAGNOSIS — Z1231 Encounter for screening mammogram for malignant neoplasm of breast: Secondary | ICD-10-CM

## 2011-12-18 ENCOUNTER — Telehealth: Payer: Self-pay | Admitting: *Deleted

## 2011-12-18 DIAGNOSIS — E569 Vitamin deficiency, unspecified: Secondary | ICD-10-CM

## 2011-12-18 DIAGNOSIS — D071 Carcinoma in situ of vulva: Secondary | ICD-10-CM

## 2011-12-18 NOTE — Telephone Encounter (Signed)
Pt informed with the below note she had her vit. D level drawn at her pcp doctor and will fax results over tomorrow.

## 2011-12-18 NOTE — Telephone Encounter (Signed)
Pt was told to continue to take vit d 50,000 units q monthly. Pt asked if you would like her to take vit. D with calcium as well? She was taking vit. D with calcium before but you told her to stop and only take the 50,000 units. See paper chart.  Please advise

## 2011-12-18 NOTE — Telephone Encounter (Signed)
Patient is to continue to take her vitamin D 50,000 units q. monthly. She should only take calcium daily in the range of 1200-1500 mg daily in a single tablet not in a combination with vitamin D because she does not need more than the vitamin D monthly I prescribed

## 2011-12-19 NOTE — Telephone Encounter (Signed)
Pt faxed over recent Vit D level results.

## 2011-12-19 NOTE — Telephone Encounter (Signed)
Please call patient and tell her that I saw her vitamin D level results of 22.7 (normal 30-100). I noticed she was taking vitamin D 50,000 units monthly. Please call in 50,000 units q. weekly for the next 12 weeks and then come by the office to check her vitamin D level either here or at [facility]. Tell her to stop taking the monthly dose. Once she finishes the 12 weeks I would like her to take vitamin D 3 2000 units every day instead of the monthly dose.

## 2011-12-20 MED ORDER — ESTRADIOL 0.05 MG/24HR TD PTTW: 1.0000 | MEDICATED_PATCH | TRANSDERMAL | Status: DC

## 2011-12-20 MED ORDER — RALOXIFENE HCL 60 MG PO TABS: 60.0000 mg | ORAL_TABLET | Freq: Every day | ORAL | Status: DC

## 2011-12-20 MED ORDER — VITAMIN D (ERGOCALCIFEROL) 1.25 MG (50000 UNIT) PO CAPS: 50000.0000 [IU] | ORAL_CAPSULE | ORAL | Status: DC

## 2011-12-20 NOTE — Telephone Encounter (Signed)
Pt informed with the below note, rx sent to pharmacy. 

## 2012-01-18 ENCOUNTER — Telehealth: Payer: Self-pay | Admitting: *Deleted

## 2012-01-18 MED ORDER — ESTRADIOL 0.05 MG/24HR TD PTTW: 1.0000 | MEDICATED_PATCH | TRANSDERMAL | Status: DC

## 2012-01-18 NOTE — Telephone Encounter (Signed)
Pt called requesting her vivelle dot 0.05mg  patch sent to [facility], rx sent with refill til annual

## 2012-02-05 ENCOUNTER — Telehealth: Payer: Self-pay | Admitting: *Deleted

## 2012-02-05 MED ORDER — ESTRADIOL 0.05 MG/24HR TD PTTW: 1.0000 | MEDICATED_PATCH | TRANSDERMAL | Status: DC

## 2012-02-05 NOTE — Telephone Encounter (Signed)
Pt called requesting refill on vivelle dot patch 0.5 mg. rx sent.

## 2012-02-20 ENCOUNTER — Ambulatory Visit (INDEPENDENT_AMBULATORY_CARE_PROVIDER_SITE_OTHER)
Admission: RE | Admit: 2012-02-20 | Discharge: 2012-02-20 | Disposition: A | Discharge status: Home / Self Care | Payer: Managed Care, Other (non HMO) | Source: Ambulatory Visit | Attending: Internal Medicine | Admitting: Internal Medicine

## 2012-02-20 ENCOUNTER — Other Ambulatory Visit: Payer: Managed Care, Other (non HMO)

## 2012-02-20 ENCOUNTER — Encounter: Payer: Self-pay | Admitting: Internal Medicine

## 2012-02-20 ENCOUNTER — Ambulatory Visit (INDEPENDENT_AMBULATORY_CARE_PROVIDER_SITE_OTHER): Payer: Managed Care, Other (non HMO) | Admitting: Internal Medicine

## 2012-02-20 VITALS — BP 104/60 | HR 84 | Ht 66.0 in | Wt 134.6 lb

## 2012-02-20 DIAGNOSIS — J449 Chronic obstructive pulmonary disease, unspecified: Secondary | ICD-10-CM

## 2012-02-20 DIAGNOSIS — K2270 Barrett's esophagus without dysplasia: Secondary | ICD-10-CM

## 2012-02-20 DIAGNOSIS — J310 Chronic rhinitis: Secondary | ICD-10-CM

## 2012-02-20 MED ORDER — IPRATROPIUM BROMIDE 0.06 % NA SOLN: 2.0000 | Freq: Four times a day (QID) | NASAL | Status: DC

## 2012-02-20 NOTE — Progress Notes (Signed)
02/21/11- 57 yoF former smoker followed for COPD, rhinitis LOV- 02/22/10 Husband here. Had flu shot. Since last here has had a few episodes of shortness of breath, felt as difficulty taking in her breath, without wheeze or cough. Husband's rescue inhaler has helped with the use. This feeling is more common in summer and fall but not positional. She occasionally wakes at night with throat tickle but this shortness of breath sensation is also seen in the daytime and that work. 4 rhinitis, ipratropium nasal spray has been good but it doesn't seem to be lasting as long as it did. Mostly she has bothersome rhinorrhea without a lot of sneeze or congestion. Today she feels "fine".  02/20/12- 72 yoF former smoker followed for COPD, rhinitis complicated by hx GERD/ Barrett FOLLOWS FOR: no more than usual SOB, denies any wheezing, cough, or congestion at this time Had flu vax. Hx red arm local reaction to pneumovax in past. Denies breathing problems- no cough or wheeze. Has symptomatic reflux on Dexilant- known GERD/ Barrett's- Dr Chales [facility] [place]. Brother [name] sees Dr Delford Field here for bullous emphysema.   ROS-see HPI Constitutional:   No-   weight loss, night sweats, fevers, chills, fatigue, lassitude. HEENT:   No-  headaches, difficulty swallowing, tooth/dental problems, sore throat,       No-  sneezing, itching, ear ache, nasal congestion,+ post nasal drip,  CV:  No-   chest pain, orthopnea, PND, swelling in lower extremities, anasarca, dizziness, palpitations Resp: Per HPI- shortness of breath with exertion or at rest.              No-   productive cough,  No non-productive cough,  No- coughing up of blood.              No-   change in color of mucus.  No- wheezing.   Skin: No-   rash or lesions. GI:  + heartburn, indigestion, No-abdominal pain, nausea, vomiting,  GU:  MS:  No-   joint pain or swelling.   Neuro-     nothing unusual Psych:  No- change in mood or affect. No depression or  anxiety.  No memory loss.  OBJ General- Alert, Oriented, Affect-appropriate, Distress- none acute; well-appearing Skin- rash-none, lesions- none, excoriation- none Lymphadenopathy- none Head- atraumatic            Eyes- Gross vision intact, PERRLA, conjunctivae clear secretions            Ears- Hearing, canals-normal            Nose- Clear, no-Septal dev, mucus, polyps, erosion, perforation             Throat- Mallampati II , mucosa clear , drainage- none, tonsils- atrophic Neck- flexible , trachea midline, no stridor , thyroid nl, carotid no bruit Chest - symmetrical excursion , unlabored           Heart/CV- RRR , no murmur , no gallop  , no rub, nl s1 s2                           - JVD- none , edema- none, stasis changes- none, varices- none           Lung- clear to P&A, wheeze- none, cough- none , dullness-none, rub- none           Chest wall-  Abd-  Br/ Gen/ Rectal- Not done, not indicated Extrem- cyanosis- none, clubbing, none, atrophy- none,  strength- nl Neuro- grossly intact to observation

## 2012-02-20 NOTE — Patient Instructions (Addendum)
Order- lab- test for a1AT deficiency  Order- CXR  Dx COPD  Order- future - schedule PFT for next visit

## 2012-02-23 ENCOUNTER — Telehealth: Payer: Self-pay | Admitting: Internal Medicine

## 2012-02-23 NOTE — Progress Notes (Signed)
Quick Note:  Pt aware of results. ______ 

## 2012-02-23 NOTE — Telephone Encounter (Signed)
Called and spoke with patient, informed her of results/recs as listed below per Dr. Maple Hudson.  Patient Verbalized understanding and nothing further needed at this time.  Notes Recorded by Waymon Budge, MD on 02/20/2012 at 7:54 PM CXR- mild emphysema changes, no other concerns.

## 2012-02-28 LAB — ALPHA-1 ANTITRYPSIN PHENOTYPE: A-1 Antitrypsin: 148 mg/dL (ref 83–199)

## 2012-03-04 NOTE — Assessment & Plan Note (Signed)
Significant GERD despite dexilant. She is strongly advised to contact her GI office for early reassessment.

## 2012-03-04 NOTE — Assessment & Plan Note (Signed)
Ipratropium nasal spray has worked well to control watery rhinorhea when needed. Plan- refill.

## 2012-03-04 NOTE — Assessment & Plan Note (Addendum)
Brother with bullous emphysema. She is currently doing well, but time to update CXR. Doing well without bronchodilators. Plan- CXR, PFT

## 2012-03-06 NOTE — Progress Notes (Signed)
Quick Note:  Spoke with pt and notified of results per Dr. Young. Pt verbalized understanding and denied any questions.  ______ 

## 2012-06-11 ENCOUNTER — Other Ambulatory Visit: Payer: Self-pay | Admitting: Internal Medicine

## 2012-10-08 ENCOUNTER — Other Ambulatory Visit: Payer: Self-pay

## 2012-10-08 DIAGNOSIS — Z1231 Encounter for screening mammogram for malignant neoplasm of breast: Secondary | ICD-10-CM

## 2012-10-17 ENCOUNTER — Encounter: Payer: Self-pay | Admitting: Gynecology

## 2012-10-17 ENCOUNTER — Other Ambulatory Visit (HOSPITAL_COMMUNITY)
Admission: RE | Admit: 2012-10-17 | Discharge: 2012-10-17 | Disposition: A | Discharge status: Home / Self Care | Payer: Managed Care, Other (non HMO) | Source: Ambulatory Visit | Attending: Gynecology | Admitting: Gynecology

## 2012-10-17 ENCOUNTER — Ambulatory Visit (INDEPENDENT_AMBULATORY_CARE_PROVIDER_SITE_OTHER): Payer: Managed Care, Other (non HMO) | Admitting: Gynecology

## 2012-10-17 VITALS — BP 120/78 | Ht 65.75 in | Wt 135.0 lb

## 2012-10-17 DIAGNOSIS — M8580 Other specified disorders of bone density and structure, unspecified site: Secondary | ICD-10-CM

## 2012-10-17 DIAGNOSIS — Z01419 Encounter for gynecological examination (general) (routine) without abnormal findings: Secondary | ICD-10-CM

## 2012-10-17 DIAGNOSIS — N951 Menopausal and female climacteric states: Secondary | ICD-10-CM

## 2012-10-17 DIAGNOSIS — Z1159 Encounter for screening for other viral diseases: Secondary | ICD-10-CM

## 2012-10-17 DIAGNOSIS — Z23 Encounter for immunization: Secondary | ICD-10-CM

## 2012-10-17 DIAGNOSIS — M949 Disorder of cartilage, unspecified: Secondary | ICD-10-CM

## 2012-10-17 DIAGNOSIS — Z8639 Personal history of other endocrine, nutritional and metabolic disease: Secondary | ICD-10-CM

## 2012-10-17 DIAGNOSIS — Z1151 Encounter for screening for human papillomavirus (HPV): Secondary | ICD-10-CM | POA: Insufficient documentation

## 2012-10-17 DIAGNOSIS — D071 Carcinoma in situ of vulva: Secondary | ICD-10-CM

## 2012-10-17 DIAGNOSIS — Z79890 Hormone replacement therapy (postmenopausal): Secondary | ICD-10-CM

## 2012-10-17 DIAGNOSIS — Z780 Asymptomatic menopausal state: Secondary | ICD-10-CM

## 2012-10-17 LAB — LIPID PANEL
Cholesterol: 157 mg/dL (ref 0–200)
Triglycerides: 110 mg/dL (ref <150)
VLDL: 22 mg/dL (ref 0–40)

## 2012-10-17 LAB — CBC WITH DIFFERENTIAL/PLATELET
Basophils Absolute: 0 10*3/uL (ref 0.0–0.1)
Basophils Relative: 0 % (ref 0–1)
HCT: 39.1 % (ref 36.0–46.0)
Lymphocytes Relative: 33 % (ref 12–46)
MCHC: 33.5 g/dL (ref 30.0–36.0)
Monocytes Absolute: 0.3 10*3/uL (ref 0.1–1.0)
Neutro Abs: 2.3 10*3/uL (ref 1.7–7.7)
Neutrophils Relative %: 57 % (ref 43–77)
Platelets: 250 10*3/uL (ref 150–400)
RDW: 13.5 % (ref 11.5–15.5)
WBC: 4 10*3/uL (ref 4.0–10.5)

## 2012-10-17 LAB — COMPREHENSIVE METABOLIC PANEL
CO2: 28 mEq/L (ref 19–32)
Calcium: 9 mg/dL (ref 8.4–10.5)
Chloride: 104 mEq/L (ref 96–112)
Glucose, Bld: 86 mg/dL (ref 70–99)
Sodium: 140 mEq/L (ref 135–145)
Total Bilirubin: 0.4 mg/dL (ref 0.3–1.2)
Total Protein: 6.4 g/dL (ref 6.0–8.3)

## 2012-10-17 MED ORDER — RALOXIFENE HCL 60 MG PO TABS: 60.0000 mg | ORAL_TABLET | Freq: Every day | ORAL | Status: DC

## 2012-10-17 NOTE — Patient Instructions (Addendum)

## 2012-10-17 NOTE — Progress Notes (Addendum)
Cheyenne Gallegos [birth year] [id number]   History:    57 y.o.  for annual gyn exam  With complaints of vasomotor symptoms despite being on Vivelle-Dot 0.05 mg patch twice a week. Patient visited a compound pharmacy   and had blood work drawn with the following results: Free testosterone less than 0.2 Progesterone 0.2 DHEA 62.8 Estradiol 11.2  The PharmD had recommended the patient to go on by identical hormone replacement with the following combination and to DC the Vivelle-Dot:  Dissolving a buccal troche to be dissolved between the gum line and cheek half twice daily. The troche would contain: Bi-est ( 50% estriol: 50% estradiol) 1.5 mg, progesterone 150 mg, testosterone 1.5 mg, and DHEA 2 mg  Patient wanted my opinion on the biochemical drug. Patient has stopped smoking approximately 11 years ago. Patient for a few years had been on Actonel because of osteopenia and has been switched to  Evista 60 mg daily. Patient has had several breast biopsies in 1979, 1980, 1983 and 1986 all benign. Her last colonoscopy was in 2008. Review of her record indicated she had severe dysplasia and LEEP cervical conization several years ago. In 1995 she had a transvaginal hysterectomy for dysfunction uterine bleeding and for dysplasia. 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 She has had history of Barrett's esophagitis and has been followed by Dr. Loreta Ave gastroenterologist.  Patient last bone density study in 2011 with stable bone marrow density in the osteopenic (decrease bone mineralization) patient is taking her calcium and vitamin D.  The patient is on Zocor for hyperlipidemia. Her primary physician is Dr. Sherral Hammers.  Patient with far history of colon polyps at age 25. Patient had colonoscopy  2013   Past medical history,surgical history, family history and social history were all reviewed and documented in the EPIC chart.  Gynecologic History No LMP recorded. Patient has had a hysterectomy. Contraception: status post hysterectomy Last Pap: 2013. Results were: normal Last mammogram: 2013. Results were: normal but dense breast  Obstetric History OB History   Grav Para Term Preterm Abortions TAB SAB Ect Mult Living   1 1 1       1      # Outc Date GA Lbr Len/2nd Wgt Sex Del Anes PTL Lv   1 TRM     M SVD  No Yes       ROS: A ROS was performed and pertinent positives and negatives are included in the history.  GENERAL: No fevers or chills. HEENT: No change in vision, no earache, sore throat or sinus congestion. NECK: No pain or stiffness. CARDIOVASCULAR: No chest pain or pressure. No palpitations. PULMONARY: No shortness of breath, cough or wheeze. GASTROINTESTINAL: No abdominal pain, nausea, vomiting or diarrhea, melena or bright red blood per rectum. GENITOURINARY: No urinary frequency, urgency, hesitancy or dysuria. MUSCULOSKELETAL: No joint or muscle pain, no back pain, no recent trauma. DERMATOLOGIC: No rash, no itching, no lesions. ENDOCRINE: No polyuria, polydipsia, no heat or cold intolerance. No recent change in weight. HEMATOLOGICAL: No anemia or easy bruising or bleeding. NEUROLOGIC: No headache, seizures, numbness, tingling or weakness. PSYCHIATRIC: No depression, no loss of interest in normal activity or change in sleep pattern.     Exam: chaperone present  BP 120/78  Ht 5' 5.75" (  1.67 m)  Wt 135 lb (61.236 kg)  BMI 21.96 kg/m2  Body mass index is 21.96 kg/(m^2).  General appearance : Well developed well nourished female. No acute distress HEENT: Neck supple, trachea midline, no carotid bruits, no thyroidmegaly Lungs: Clear to auscultation, no rhonchi or wheezes, or rib retractions  Heart: Regular rate and rhythm, no murmurs or gallops Breast:Examined in  sitting and supine position were symmetrical in appearance, no palpable masses or tenderness,  no skin retraction, no nipple inversion, no nipple discharge, no skin discoloration, no axillary or supraclavicular lymphadenopathy Abdomen: no palpable masses or tenderness, no rebound or guarding Extremities: no edema or skin discoloration or tenderness  Pelvic:  Bartholin, Urethra, Skene Glands: Within normal limits             Vagina: No gross lesions or discharge  Cervix: absent  Uterus Absent  Adnexa  Without masses or tenderness  Anus and perineum  normal   Rectovaginal  normal sphincter tone without palpated masses or tenderness             Hemoccult Records provided     Assessment/Plan:  57 y.o. female who is doing well with her Vivelle-Dot and now complaining of vasomotor symptoms. Patient would like to start the bio identical hormone. The risks benefits and pros and cons were discussed. We discussed once again the women's health initiative study. Patient would like to take it for a few months and then returned back to the office for followup. Patient fully except risk . I discussed with her the regulatory testing  of these compounding medications may be limited and unreliable.  Also discussed were the concerned that I had to combination progesterone testosterone estrogen which could increase her risk of breast cancer. She has had several breast biopsies in the past been benign. Her mammogram was very dense last year so she will get a 3-D this year. She was given a prescription Per her request for the following:   Dissolving a buccal troche to be dissolved between the gum line and cheek half twice daily. The troche would contain: Bi-est ( 50% estriol: 50% estradiol) 1.5 mg, progesterone 150 mg, testosterone 1.5 mg, and DHEA 2 mg  Patient's fasting today and will allow to be drawn: CBC, fasting lipid profile, comprehensive metabolic panel, urinalysis, TSH, and Pap smear. Patient was counseled  and received a Tdap vaccine today. Patient will schedule her bone density study. Hemoccult cards provided her to submit to the office for testing.  New CDC guidelines is recommending patients be tested once in her lifetime for hepatitis C antibody who were born between [birth year] through 1965. This was discussed with the patient today and has agreed to be tested today.        Ok Edwards MD, 10:02 AM 10/17/2012

## 2012-10-18 LAB — HEPATITIS C ANTIBODY: HCV Ab: NEGATIVE

## 2012-10-18 LAB — URINALYSIS W MICROSCOPIC + REFLEX CULTURE
Glucose, UA: NEGATIVE mg/dL
Hgb urine dipstick: NEGATIVE
Nitrite: NEGATIVE
Protein, ur: NEGATIVE mg/dL
Squamous Epithelial / LPF: NONE SEEN

## 2012-10-18 LAB — VITAMIN D 25 HYDROXY (VIT D DEFICIENCY, FRACTURES): Vit D, 25-Hydroxy: 44 ng/mL (ref 30–89)

## 2012-10-21 ENCOUNTER — Other Ambulatory Visit: Payer: Self-pay | Admitting: Gynecology

## 2012-10-21 LAB — URINE CULTURE: Colony Count: 15000

## 2012-10-21 MED ORDER — SULFAMETHOXAZOLE-TMP DS 800-160 MG PO TABS: 1.0000 | ORAL_TABLET | Freq: Two times a day (BID) | ORAL | Status: DC

## 2012-10-22 ENCOUNTER — Encounter: Payer: Self-pay | Admitting: Gynecology

## 2012-11-11 ENCOUNTER — Ambulatory Visit
Admission: RE | Admit: 2012-11-11 | Discharge: 2012-11-11 | Disposition: A | Discharge status: Home / Self Care | Payer: Managed Care, Other (non HMO) | Source: Ambulatory Visit

## 2012-11-11 DIAGNOSIS — Z1231 Encounter for screening mammogram for malignant neoplasm of breast: Secondary | ICD-10-CM

## 2012-11-14 ENCOUNTER — Other Ambulatory Visit: Payer: Self-pay | Admitting: Gynecology

## 2012-11-14 DIAGNOSIS — R928 Other abnormal and inconclusive findings on diagnostic imaging of breast: Secondary | ICD-10-CM

## 2012-12-02 ENCOUNTER — Ambulatory Visit
Admission: RE | Admit: 2012-12-02 | Discharge: 2012-12-02 | Disposition: A | Discharge status: Home / Self Care | Payer: Managed Care, Other (non HMO) | Source: Ambulatory Visit | Attending: Gynecology | Admitting: Gynecology

## 2012-12-02 DIAGNOSIS — R928 Other abnormal and inconclusive findings on diagnostic imaging of breast: Secondary | ICD-10-CM

## 2012-12-04 ENCOUNTER — Other Ambulatory Visit: Payer: Managed Care, Other (non HMO)

## 2012-12-10 ENCOUNTER — Ambulatory Visit (INDEPENDENT_AMBULATORY_CARE_PROVIDER_SITE_OTHER): Payer: Managed Care, Other (non HMO)

## 2012-12-10 DIAGNOSIS — Z780 Asymptomatic menopausal state: Secondary | ICD-10-CM

## 2012-12-10 DIAGNOSIS — Z8639 Personal history of other endocrine, nutritional and metabolic disease: Secondary | ICD-10-CM

## 2012-12-10 DIAGNOSIS — M8580 Other specified disorders of bone density and structure, unspecified site: Secondary | ICD-10-CM

## 2012-12-10 DIAGNOSIS — M899 Disorder of bone, unspecified: Secondary | ICD-10-CM

## 2013-01-30 ENCOUNTER — Other Ambulatory Visit: Payer: Self-pay

## 2013-02-18 ENCOUNTER — Ambulatory Visit (INDEPENDENT_AMBULATORY_CARE_PROVIDER_SITE_OTHER): Payer: Managed Care, Other (non HMO) | Admitting: Internal Medicine

## 2013-02-18 ENCOUNTER — Encounter: Payer: Self-pay | Admitting: Internal Medicine

## 2013-02-18 ENCOUNTER — Ambulatory Visit (INDEPENDENT_AMBULATORY_CARE_PROVIDER_SITE_OTHER): Payer: Managed Care, Other (non HMO) | Admitting: Cardiovascular Disease

## 2013-02-18 ENCOUNTER — Encounter: Payer: Self-pay | Admitting: Cardiovascular Disease

## 2013-02-18 VITALS — BP 120/72 | HR 77 | Ht 66.0 in | Wt 143.6 lb

## 2013-02-18 VITALS — BP 114/60 | HR 72 | Ht 66.0 in | Wt 141.5 lb

## 2013-02-18 DIAGNOSIS — J449 Chronic obstructive pulmonary disease, unspecified: Secondary | ICD-10-CM

## 2013-02-18 DIAGNOSIS — E7800 Pure hypercholesterolemia, unspecified: Secondary | ICD-10-CM

## 2013-02-18 DIAGNOSIS — R0602 Shortness of breath: Secondary | ICD-10-CM

## 2013-02-18 DIAGNOSIS — Z23 Encounter for immunization: Secondary | ICD-10-CM

## 2013-02-18 DIAGNOSIS — R002 Palpitations: Secondary | ICD-10-CM

## 2013-02-18 DIAGNOSIS — J310 Chronic rhinitis: Secondary | ICD-10-CM

## 2013-02-18 DIAGNOSIS — J4489 Other specified chronic obstructive pulmonary disease: Secondary | ICD-10-CM

## 2013-02-18 LAB — PULMONARY FUNCTION TEST
DL/VA % pred: 63 %
DLCO unc % pred: 70 %
DLCO unc: 19.07 ml/min/mmHg
FEF 25-75 Post: 1.05 L/sec
FEF2575-%Change-Post: 1 %
FEF2575-%Pred-Pre: 40 %
FEV1-%Change-Post: 1 %
FEV1-%Pred-Post: 82 %
FEV1-%Pred-Pre: 80 %
FEV1-Post: 2.32 L
FEV1FVC-%Change-Post: -2 %
FEV6-%Pred-Post: 113 %
FEV6-%Pred-Pre: 109 %
FEV6-Pre: 3.82 L
FEV6FVC-%Pred-Post: 99 %
FEV6FVC-%Pred-Pre: 99 %
Post FEV1/FVC ratio: 56 %
Post FEV6/FVC ratio: 96 %
Pre FEV6/FVC Ratio: 96 %
RV % pred: 119 %
RV: 2.43 L
TLC % pred: 118 %
TLC: 6.31 L

## 2013-02-18 MED ORDER — SIMVASTATIN 40 MG PO TABS: 40.0000 mg | ORAL_TABLET | Freq: Every evening | ORAL | Status: DC

## 2013-02-18 MED ORDER — IPRATROPIUM BROMIDE 0.06 % NA SOLN: NASAL | Status: DC

## 2013-02-18 NOTE — Progress Notes (Signed)
02/21/11- 32 yoF former smoker followed for COPD, rhinitis LOV- 02/22/10 Husband here. Had flu shot. Since last here has had a few episodes of shortness of breath, felt as difficulty taking in her breath, without wheeze or cough. Husband's rescue inhaler has helped with the use. This feeling is more common in summer and fall but not positional. She occasionally wakes at night with throat tickle but this shortness of breath sensation is also seen in the daytime and that work. 4 rhinitis, ipratropium nasal spray has been good but it doesn't seem to be lasting as long as it did. Mostly she has bothersome rhinorrhea without a lot of sneeze or congestion. Today she feels "fine".  02/20/12- 1 yoF former smoker followed for COPD, rhinitis complicated by hx GERD/ Barrett FOLLOWS FOR: no more than usual SOB, denies any wheezing, cough, or congestion at this time Had flu vax. Hx red arm local reaction to pneumovax in past. Denies breathing problems- no cough or wheeze. Has symptomatic reflux on Dexilant- known GERD/ Barrett's- Dr Chales Abrah[facility] [place]. Brother [name] sees Dr Delford Field here for bullous emphysema.   02/18/13- 20 yoF former smoker followed for COPD, rhinitis complicated by hx GERD/ Barrett FOLLOWS FOR: States her breathing has been okay(back at base line); review PFT with patient. PFT- Conclusions: Although there is airway obstruction and a diffusion defect suggesting emphysema, the absence of overinflation is inconsistent with that diagnosis. Pulmonary Function Diagnosis: Minimal Obstructive Airways Disease with insignificant response to bronchodilator Mild Diffusion Defect a1AT 02/19/13- was normal MM CXR 02/20/12 IMPRESSION:  Hyperinflation/COPD; no acute findings.  Original Report Authenticated By: Jeronimo Greaves, M.D.   ROS-see HPI Constitutional:   No-   weight loss, night sweats, fevers, chills, fatigue, lassitude. HEENT:   No-  headaches, difficulty swallowing, tooth/dental  problems, sore throat,       No-  sneezing, itching, ear ache, nasal congestion,+ post nasal drip,  CV:  No-   chest pain, orthopnea, PND, swelling in lower extremities, anasarca, dizziness, palpitations Resp: +Per HPI- shortness of breath with exertion or at rest.              No-   productive cough,  No non-productive cough,  No- coughing up of blood.              No-   change in color of mucus.  No- wheezing.   Skin: No-   rash or lesions. GI:  + heartburn, indigestion, No-abdominal pain, nausea, vomiting,  GU:  MS:  No-   joint pain or swelling.   Neuro-     nothing unusual Psych:  No- change in mood or affect. No depression or anxiety.  No memory loss.  OBJ General- Alert, Oriented, Affect-appropriate, Distress- none acute; well-appearing Skin- rash-none, lesions- none, excoriation- none Lymphadenopathy- none Head- atraumatic            Eyes- Gross vision intact, PERRLA, conjunctivae clear secretions            Ears- Hearing, canals-normal            Nose- Clear, no-Septal dev, mucus, polyps, erosion, perforation             Throat- Mallampati II , mucosa clear , drainage- none, tonsils- atrophic Neck- flexible , trachea midline, no stridor , thyroid nl, carotid no bruit Chest - symmetrical excursion , unlabored           Heart/CV- RRR , no murmur , no gallop  , no rub, nl s1  s2                           - JVD- none , edema- none, stasis changes- none, varices- none           Lung- clear to P&A, wheeze- none, cough- none , dullness-none, rub- none           Chest wall-  Abd-  Br/ Gen/ Rectal- Not done, not indicated Extrem- cyanosis- none, clubbing, none, atrophy- none, strength- nl Neuro- grossly intact to observation

## 2013-02-18 NOTE — Patient Instructions (Signed)
Your physician recommends that you schedule a follow-up appointment in: 12 months.  

## 2013-02-18 NOTE — Patient Instructions (Signed)
Refill script Atrovent/ ipratropium nasal spray  Flu vax  Please call if we can help

## 2013-02-18 NOTE — Progress Notes (Signed)
PFT done today. 

## 2013-02-20 DIAGNOSIS — R002 Palpitations: Secondary | ICD-10-CM | POA: Insufficient documentation

## 2013-02-20 NOTE — Assessment & Plan Note (Signed)
Satisfactory lipid parameters of her medications. No adjustments are planned. Notes that she had myalgia while taking Vytorin but seems to tolerate simple simvastatin without problems.

## 2013-02-20 NOTE — Progress Notes (Signed)
Patient ID: Cheyenne Gallegos, female   DOB: 07-04-1955, 57 y.o.   MRN: [id number]      Reason for office visit Hyperlipidemia, palpitations  This is my first encounter with Cheyenne Gallegos, a former patient of Dr. Susa Griffins. Her husband Roe Coombs is also a patient in the practice. She does not have known structural heart disease but does have hyperlipidemia takes a statin. She does not have known structural heart disease. She had a normal nuclear stress test in September of 2011. Left ventricular systolic function was normal with an EF of 62%. She does not have chest pain or shortness of breath but is troubled by occasional palpitations, maybe occurring once a month. These generally occur at rest. Sometimes they occur in this short bursts, never longer than a minute. She works out at [employer] one to 3 days a week for a total of about an hour spending half of it on the treadmill and half on the weight machines. She never has trouble while exercising.  Allergies  Allergen Reactions  . Morphine Nausea And Vomiting    Current Outpatient Prescriptions  Medication Sig Dispense Refill  . calcium carbonate (OS-CAL) 600 MG TABS Take 600 mg by mouth 2 (two) times daily with a meal.      . clorazepate (TRANXENE) 3.75 MG tablet Take 3.75 mg by mouth 2 (two) times daily as needed.      Marland Kitchen dexlansoprazole (DEXILANT) 60 MG capsule Take 60 mg by mouth daily.        . Probiotic Product (PROBIOTIC DAILY PO) Take 1 tablet by mouth daily.      . raloxifene (EVISTA) 60 MG tablet Take 1 tablet (60 mg total) by mouth daily.  30 tablet  10  . simvastatin (ZOCOR) 40 MG tablet Take 1 tablet (40 mg total) by mouth every evening.  30 tablet  11  . vitamin E 1000 UNIT capsule Take 1,000 Units by mouth daily.      Marland Kitchen zolpidem (AMBIEN) 5 MG tablet Take 1 tablet (5 mg total) by mouth at bedtime as needed. sleep  30 tablet  0  . Cetirizine HCl (ZYRTEC PO) Take 1 tablet by mouth daily.       Marland Kitchen ipratropium (ATROVENT) 0.06 %  nasal spray 1-2 sprays in each nostril daily as needed  15 mL  prn  . VYVANSE 70 MG capsule Take 70 mg by mouth daily.       No current facility-administered medications for this visit.    Past Medical History  Diagnosis Date  . Nausea   . Normal spontaneous vaginal delivery   . VIN III (vulvar intraepithelial neoplasia III)   . Condyloma     vulvar  . 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 Osteopenia   . GERD (gastroesophageal reflux disease)   . Barretts esophagus   . Insomnia   . ADD (attention deficit disorder with hyperactivity)   . Vitamin D deficiency   . Hypercholesteremia     Past Surgical History  Procedure Laterality Date  . Total vaginal hysterectomy  1995  . Foot surgery  2001  . Tubal ligation    . Vulva surgery    . Co2 laser of labia    . Breast biopsy  [phone]    Family History  Problem Relation Age of Onset  . Hypertension Mother   . Diabetes Mother   . Heart disease Mother   . COPD Brother   .  Colon cancer Paternal Grandmother     History   Social History  . Marital Status: Married    Spouse Name: N/A    Number of Children: N/A  . Years of Education: N/A   Occupational History  . superivsor at [employer]    Social History Main Topics  . Smoking status: Former Smoker -- 1.50 packs/day for 28 years    Types: Cigarettes    Quit date: 02/05/2000  . Smokeless tobacco: Not on file  . Alcohol Use: Yes     Comment: rare  . Drug Use: Yes  . Sexual Activity: Not on file   Other Topics Concern  . Not on file   Social History Narrative  . No narrative on file    Review of systems: The patient specifically denies any chest pain at rest or with exertion, dyspnea at rest or with exertion, orthopnea, paroxysmal nocturnal dyspnea, syncope, focal neurological deficits, intermittent claudication, lower extremity edema, unexplained weight gain, cough, hemoptysis or wheezing.  The patient also denies abdominal pain, nausea,  vomiting, dysphagia, diarrhea, constipation, polyuria, polydipsia, dysuria, hematuria, frequency, urgency, abnormal bleeding or bruising, fever, chills, unexpected weight changes, mood swings, change in skin or hair texture, change in voice quality, auditory or visual problems, allergic reactions or rashes, new musculoskeletal complaints other than usual "aches and pains".   PHYSICAL EXAM BP 120/72  Pulse 77  Ht 5\' 6"  (1.676 m)  Wt 143 lb 9.6 oz (65.137 kg)  BMI 23.19 kg/m2  General: Alert, oriented x3, no distress Head: no evidence of trauma, PERRL, EOMI, no exophtalmos or lid lag, no myxedema, no xanthelasma; normal ears, nose and oropharynx Neck: normal jugular venous pulsations and no hepatojugular reflux; brisk carotid pulses without delay and no carotid bruits Chest: clear to auscultation, no signs of consolidation by percussion or palpation, normal fremitus, symmetrical and full respiratory excursions Cardiovascular: normal position and quality of the apical impulse, regular rhythm, normal first and second heart sounds, no murmurs, rubs or gallops Abdomen: no tenderness or distention, no masses by palpation, no abnormal pulsatility or arterial bruits, normal bowel sounds, no hepatosplenomegaly Extremities: no clubbing, cyanosis or edema; 2+ radial, ulnar and brachial pulses bilaterally; 2+ right femoral, posterior tibial and dorsalis pedis pulses; 2+ left femoral, posterior tibial and dorsalis pedis pulses; no subclavian or femoral bruits Neurological: grossly nonfocal   EKG: Normal sinus rhythm, normal tracing  Lipid Panel     Component Value Date/Time   CHOL 157 10/17/2012 0938   TRIG 110 10/17/2012 0938   HDL 50 10/17/2012 0938   CHOLHDL 3.1 10/17/2012 0938   VLDL 22 10/17/2012 0938   LDLCALC 85 10/17/2012 0938    BMET    Component Value Date/Time   NA 140 10/17/2012 0938   K 3.7 10/17/2012 0938   CL 104 10/17/2012 0938   CO2 28 10/17/2012 0938   GLUCOSE 86 10/17/2012 0938    BUN 16 10/17/2012 0938   CREATININE 0.96 10/17/2012 0938   CREATININE 0.78 09/30/2011 1658   CALCIUM 9.0 10/17/2012 0938   GFRNONAA >90 09/30/2011 1658   GFRAA >90 09/30/2011 1658     ASSESSMENT AND PLAN Palpitations By her description these sound benign, even more so since they're occurring in an otherwise healthy heart. They're most likely due to isolated premature atrial or ventricular beats, but we have never "caught them". Specific therapy does not appear to be justified.  Hypercholesterolemia Satisfactory lipid parameters of her medications. No adjustments are planned. Notes that she had myalgia while  taking Vytorin but seems to tolerate simple simvastatin without problems.   Patient Instructions  Your physician recommends that you schedule a follow-up appointment in: 12 months     Orders Placed This Encounter  Procedures  . EKG 12-Lead   Meds ordered this encounter  Medications  . VYVANSE 70 MG capsule    Sig: Take 70 mg by mouth daily.  . Probiotic Product (PROBIOTIC DAILY PO)    Sig: Take 1 tablet by mouth daily.  . vitamin E 1000 UNIT capsule    Sig: Take 1,000 Units by mouth daily.  Marland Kitchen DISCONTD: simvastatin (ZOCOR) 40 MG tablet    Sig: Take 1 tablet (40 mg total) by mouth every evening.    Dispense:  30 tablet    Refill:  11  . simvastatin (ZOCOR) 40 MG tablet    Sig: Take 1 tablet (40 mg total) by mouth every evening.    Dispense:  30 tablet    Refill:  [address] [place], [facility] [phone] office [phone] pager

## 2013-02-20 NOTE — Assessment & Plan Note (Signed)
By her description these sound benign, even more so since they're occurring in an otherwise healthy heart. They're most likely due to isolated premature atrial or ventricular beats, but we have never "caught them". Specific therapy does not appear to be justified.

## 2013-02-24 ENCOUNTER — Encounter: Payer: Self-pay | Admitting: Cardiovascular Disease

## 2013-03-08 NOTE — Assessment & Plan Note (Signed)
Plan-refill ipratropium nasal spray

## 2013-03-08 NOTE — Assessment & Plan Note (Signed)
Very minimal obstructive airways disease. She may get slight seasonal exacerbation as a mild intermittent asthma pattern.

## 2013-03-24 ENCOUNTER — Encounter: Payer: Self-pay | Admitting: Internal Medicine

## 2013-04-02 ENCOUNTER — Encounter: Payer: Self-pay | Admitting: Gynecology

## 2013-04-02 ENCOUNTER — Ambulatory Visit (INDEPENDENT_AMBULATORY_CARE_PROVIDER_SITE_OTHER): Payer: Managed Care, Other (non HMO) | Admitting: Gynecology

## 2013-04-02 VITALS — BP 144/80

## 2013-04-02 DIAGNOSIS — N951 Menopausal and female climacteric states: Secondary | ICD-10-CM

## 2013-04-02 DIAGNOSIS — R234 Changes in skin texture: Secondary | ICD-10-CM

## 2013-04-02 DIAGNOSIS — Z79890 Hormone replacement therapy (postmenopausal): Secondary | ICD-10-CM

## 2013-04-02 DIAGNOSIS — M8580 Other specified disorders of bone density and structure, unspecified site: Secondary | ICD-10-CM

## 2013-04-02 DIAGNOSIS — R51 Headache: Secondary | ICD-10-CM

## 2013-04-02 DIAGNOSIS — M899 Disorder of bone, unspecified: Secondary | ICD-10-CM

## 2013-04-02 DIAGNOSIS — R238 Other skin changes: Secondary | ICD-10-CM

## 2013-04-02 DIAGNOSIS — R232 Flushing: Secondary | ICD-10-CM

## 2013-04-02 DIAGNOSIS — Z8639 Personal history of other endocrine, nutritional and metabolic disease: Secondary | ICD-10-CM

## 2013-04-02 DIAGNOSIS — M949 Disorder of cartilage, unspecified: Secondary | ICD-10-CM

## 2013-04-02 MED ORDER — ESTRADIOL 0.1 MG/24HR TD PTTW: 1.0000 | MEDICATED_PATCH | TRANSDERMAL | Status: DC

## 2013-04-02 NOTE — Patient Instructions (Signed)

## 2013-04-02 NOTE — Progress Notes (Signed)
   Patient is a 58 year old who presented to the office today for discussion on her vasomotor symptoms and treatment previously recommended by her from a PharmD. The patient was having vasomotor symptoms despite being on Vivelle-Dot 0.05 mg twice a week and decided that based on the following results:  Free testosterone less than 0.2  Progesterone 0.2  DHEA 62.8  Estradiol 11.2  She wanted to try the following: Dissolving a buccal troche to be dissolved between the gum line and cheek half twice daily. The troche would contain:  Bi-est ( 50% estriol: 50% estradiol) 1.5 mg, progesterone 150 mg, testosterone 1.5 mg, and DHEA 2 mg  The patient had been on Evista 60 mg daily because of her osteopenia and low body weight and being menopausal. She had stopped smoking 12 years ago. See previous note dated 10/18/1998 4T for additional details of her past medical history. She has had a prior transvaginal hysterectomy in 1995 as a result of dysfunction uterine bleeding and cervical dysplasia.  The patient stated that since she started the above regimen first few months she was sleeping better but had increased headaches and worsening of her restless leg syndrome and oily skin and her vasomotor symptoms were still present but to a lesser degree. I have discussed with her my concerns of her being on these bile identical hormone combination. We had discussed that given today the results of the women's health initiative study. Patient fully accepted the risk and is here to discuss alternative. She has had several breast biopsies in the past which have been benign. I was concerned with a combination of testosterone and progesterone and estrogen could increase her risk of breast cancer and now she agrees that she would like to stop this medication.  We discussed also that the Evista one of the side effects as headaches as well as hot flashes. We reviewed her bone density study from 2014 and is stable from 2010 with  osteopenia with a T score of -1.7 at the [facility] spine. I'm going to recommend that she discontinue the Evista and continue to take her calcium and vitamin D twice a day and continue active lifestyle. Were going to start her on transdermal Minivelle 0.1 mg to apply twice a week and to discontinue altogether biochemical hormones. We will begin to taper off slowly over the next 2 years on this transdermal patch. Once again the risks benefits pros and cons were discussed. Time of consultation 30 minutes.

## 2013-10-21 ENCOUNTER — Encounter: Payer: Self-pay | Admitting: Gynecology

## 2013-10-21 ENCOUNTER — Other Ambulatory Visit (HOSPITAL_COMMUNITY)
Admission: RE | Admit: 2013-10-21 | Discharge: 2013-10-21 | Disposition: A | Discharge status: Home / Self Care | Payer: Managed Care, Other (non HMO) | Source: Ambulatory Visit | Attending: Gynecology | Admitting: Gynecology

## 2013-10-21 ENCOUNTER — Ambulatory Visit (INDEPENDENT_AMBULATORY_CARE_PROVIDER_SITE_OTHER): Payer: Managed Care, Other (non HMO) | Admitting: Gynecology

## 2013-10-21 VITALS — BP 120/86 | Ht 66.0 in | Wt 144.0 lb

## 2013-10-21 DIAGNOSIS — M8580 Other specified disorders of bone density and structure, unspecified site: Secondary | ICD-10-CM

## 2013-10-21 DIAGNOSIS — Z01419 Encounter for gynecological examination (general) (routine) without abnormal findings: Secondary | ICD-10-CM | POA: Insufficient documentation

## 2013-10-21 DIAGNOSIS — M949 Disorder of cartilage, unspecified: Secondary | ICD-10-CM

## 2013-10-21 DIAGNOSIS — Z79890 Hormone replacement therapy (postmenopausal): Secondary | ICD-10-CM

## 2013-10-21 DIAGNOSIS — Z8639 Personal history of other endocrine, nutritional and metabolic disease: Secondary | ICD-10-CM

## 2013-10-21 DIAGNOSIS — M899 Disorder of bone, unspecified: Secondary | ICD-10-CM

## 2013-10-21 LAB — CBC WITH DIFFERENTIAL/PLATELET
BASOS ABS: 0 10*3/uL (ref 0.0–0.1)
Basophils Relative: 0 % (ref 0–1)
EOS ABS: 0.1 10*3/uL (ref 0.0–0.7)
Eosinophils Relative: 3 % (ref 0–5)
HCT: 38.8 % (ref 36.0–46.0)
Hemoglobin: 13.1 g/dL (ref 12.0–15.0)
LYMPHS ABS: 1.4 10*3/uL (ref 0.7–4.0)
LYMPHS PCT: 30 % (ref 12–46)
MCH: 30.9 pg (ref 26.0–34.0)
MCHC: 33.8 g/dL (ref 30.0–36.0)
MCV: 91.5 fL (ref 78.0–100.0)
Monocytes Absolute: 0.3 10*3/uL (ref 0.1–1.0)
Monocytes Relative: 7 % (ref 3–12)
NEUTROS PCT: 60 % (ref 43–77)
Neutro Abs: 2.9 10*3/uL (ref 1.7–7.7)
PLATELETS: 275 10*3/uL (ref 150–400)
RBC: 4.24 MIL/uL (ref 3.87–5.11)
RDW: 13.2 % (ref 11.5–15.5)
WBC: 4.8 10*3/uL (ref 4.0–10.5)

## 2013-10-21 LAB — COMPREHENSIVE METABOLIC PANEL
ALK PHOS: 41 U/L (ref 39–117)
ALT: 21 U/L (ref 0–35)
AST: 25 U/L (ref 0–37)
Albumin: 4.3 g/dL (ref 3.5–5.2)
BILIRUBIN TOTAL: 0.5 mg/dL (ref 0.2–1.2)
BUN: 22 mg/dL (ref 6–23)
CO2: 29 mEq/L (ref 19–32)
CREATININE: 0.9 mg/dL (ref 0.50–1.10)
Calcium: 9 mg/dL (ref 8.4–10.5)
Chloride: 103 mEq/L (ref 96–112)
Glucose, Bld: 85 mg/dL (ref 70–99)
Potassium: 4 mEq/L (ref 3.5–5.3)
Sodium: 139 mEq/L (ref 135–145)
Total Protein: 6.4 g/dL (ref 6.0–8.3)

## 2013-10-21 LAB — LIPID PANEL
Cholesterol: 146 mg/dL (ref 0–200)
HDL: 57 mg/dL
LDL Cholesterol: 77 mg/dL (ref 0–99)
Total CHOL/HDL Ratio: 2.6 ratio
Triglycerides: 62 mg/dL
VLDL: 12 mg/dL (ref 0–40)

## 2013-10-21 LAB — TSH: TSH: 1.94 u[IU]/mL (ref 0.350–4.500)

## 2013-10-21 MED ORDER — ESTRADIOL 0.1 MG/24HR TD PTTW: 1.0000 | MEDICATED_PATCH | TRANSDERMAL | Status: DC

## 2013-10-21 NOTE — Progress Notes (Signed)
Cheyenne Gallegos 08-09-1955 [id number]   History:    58 y.o.  for annual gyn exam with no complaints today. Patient has had several breast biopsies on the right breast in 1979, 1980, 1983 and 1986 all benign. The patient has history of colon polyp in 2007 and 2 years ago she had a normal colonoscopy.  Review of her record indicated she had severe dysplasia and LEEP cervical conization several years ago. In 1995 she had a transvaginal hysterectomy for dysfunction uterine bleeding and for dysplasia. 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 Pap smear 2014 demonstrated atypical squamous cells of undetermined significance negative HPV  Patient has had past history vitamin D deficiency and is currently on calcium and vitamin D. Her cardiologist is treating her for hypertension and hypercholesterolemia.She has had history of Barrett's esophagitis and has been followed by Dr. Loreta Ave gastroenterologist.   The patient currently doing well on Vivelle-Dot 0.1 mg transdermal patch which she applied twice a week. Last bone density study in 2014 with the lowest T. score at the left femoral neck -1.7. Patient had been on Evista and had discontinued because of side effects.   Past medical history,surgical history, family history and social history were all reviewed and documented in the EPIC chart.  Gynecologic History No LMP recorded. Patient has had a hysterectomy. Contraception: status post hysterectomy Last Pap: 2014. Results were: ASCUS negative HPV Last mammogram: 2014. Results were: normal  Obstetric History OB History  Gravida Para Term Preterm AB SAB TAB Ectopic Multiple Living  1 1 1       1     # Outcome Date GA Lbr Len/2nd Weight Sex Delivery Anes PTL Lv  1 TRM     M SVD  N Y       ROS: A ROS was performed and pertinent positives and negatives are included in the history.  GENERAL: No  fevers or chills. HEENT: No change in vision, no earache, sore throat or sinus congestion. NECK: No pain or stiffness. CARDIOVASCULAR: No chest pain or pressure. No palpitations. PULMONARY: No shortness of breath, cough or wheeze. GASTROINTESTINAL: No abdominal pain, nausea, vomiting or diarrhea, melena or bright red blood per rectum. GENITOURINARY: No urinary frequency, urgency, hesitancy or dysuria. MUSCULOSKELETAL: No joint or muscle pain, no back pain, no recent trauma. DERMATOLOGIC: No rash, no itching, no lesions. ENDOCRINE: No polyuria, polydipsia, no heat or cold intolerance. No recent change in weight. HEMATOLOGICAL: No anemia or easy bruising or bleeding. NEUROLOGIC: No headache, seizures, numbness, tingling or weakness. PSYCHIATRIC: No depression, no loss of interest in normal activity or change in sleep pattern.     Exam: chaperone present  BP 120/86  Ht 5\' 6"  (1.676 m)  Wt 144 lb (65.318 kg)  BMI 23.25 kg/m2  Body mass index is 23.25 kg/(m^2).  General appearance : Well developed well nourished female. No acute distress HEENT: Neck supple, trachea midline, no carotid bruits, no thyroidmegaly Lungs: Clear to auscultation, no rhonchi or wheezes, or rib retractions  Heart: Regular rate and rhythm, no murmurs or gallops Breast:Examined in sitting and supine position were symmetrical in appearance, no palpable masses or tenderness,  no skin retraction, no nipple inversion, no nipple discharge, no skin discoloration, no axillary or supraclavicular lymphadenopathy Abdomen: no palpable masses or tenderness, no rebound or guarding Extremities: no edema or skin discoloration or tenderness  Pelvic:  Bartholin, Urethra, Skene Glands: Within normal limits             Vagina: No gross lesions or discharge  Cervix: Absent  Uterus  Absent  Adnexa  Without masses or tenderness  Anus and perineum  normal   Rectovaginal  normal sphincter tone without palpated masses or tenderness              Hemoccult will provide stool cards     Assessment/Plan:  58 y.o. female for annual exam menopausal with vaginal atrophy and vasomotor symptoms doing well with minivelle 0.1 milligrams transdermal patch twice a week. The following labs ordered today: CBC, fasting lipid profile, comprehensive metabolic panel, TSH, and urinalysis. Pap smear was done today. Patient with past history of VIN ! And VIN III in the past no lesions seen externally today. Will continue to do annual Pap smears. We discussed importance of calcium vitamin D and regular exercise for osteoporosis prevention. We will also check her vitamin D level today. She will be instructed to submit to the office the fecal Hemoccult cards for testing.  Note: This dictation was prepared with  Dragon/digital dictation along withSmart phrase technology. Any transcriptional errors that result from this process are unintentional.   Ok EdwardsFERNANDEZ,JUAN H MD, 9:11 AM 10/21/2013

## 2013-10-22 LAB — URINALYSIS W MICROSCOPIC + REFLEX CULTURE
BILIRUBIN URINE: NEGATIVE
Bacteria, UA: NONE SEEN
CASTS: NONE SEEN
Crystals: NONE SEEN
GLUCOSE, UA: NEGATIVE mg/dL
HGB URINE DIPSTICK: NEGATIVE
Ketones, ur: NEGATIVE mg/dL
Leukocytes, UA: NEGATIVE
Nitrite: NEGATIVE
PH: 7 (ref 5.0–8.0)
PROTEIN: NEGATIVE mg/dL
Specific Gravity, Urine: 1.009 (ref 1.005–1.030)
Squamous Epithelial / LPF: NONE SEEN
Urobilinogen, UA: 0.2 mg/dL (ref 0.0–1.0)

## 2013-10-22 LAB — VITAMIN D 25 HYDROXY (VIT D DEFICIENCY, FRACTURES): Vit D, 25-Hydroxy: 39 ng/mL (ref 30–89)

## 2013-10-22 LAB — CYTOLOGY - PAP

## 2013-10-29 ENCOUNTER — Other Ambulatory Visit: Payer: Self-pay

## 2013-10-29 DIAGNOSIS — Z1231 Encounter for screening mammogram for malignant neoplasm of breast: Secondary | ICD-10-CM

## 2013-11-21 ENCOUNTER — Ambulatory Visit
Admission: RE | Admit: 2013-11-21 | Discharge: 2013-11-21 | Disposition: A | Discharge status: Home / Self Care | Payer: Managed Care, Other (non HMO) | Source: Ambulatory Visit

## 2013-11-21 DIAGNOSIS — Z1231 Encounter for screening mammogram for malignant neoplasm of breast: Secondary | ICD-10-CM

## 2013-12-25 ENCOUNTER — Telehealth: Payer: Self-pay | Admitting: Cardiovascular Disease

## 2013-12-25 NOTE — Telephone Encounter (Signed)
Closed encounter °

## 2014-01-26 ENCOUNTER — Encounter: Payer: Self-pay | Admitting: Gynecology

## 2014-02-12 ENCOUNTER — Ambulatory Visit: Payer: Managed Care, Other (non HMO) | Admitting: Cardiovascular Disease

## 2014-02-17 ENCOUNTER — Encounter: Payer: Self-pay | Admitting: Internal Medicine

## 2014-02-17 ENCOUNTER — Ambulatory Visit (INDEPENDENT_AMBULATORY_CARE_PROVIDER_SITE_OTHER)
Admission: RE | Admit: 2014-02-17 | Discharge: 2014-02-17 | Disposition: A | Discharge status: Home / Self Care | Payer: Managed Care, Other (non HMO) | Source: Ambulatory Visit | Attending: Internal Medicine | Admitting: Internal Medicine

## 2014-02-17 ENCOUNTER — Ambulatory Visit (INDEPENDENT_AMBULATORY_CARE_PROVIDER_SITE_OTHER): Payer: Managed Care, Other (non HMO) | Admitting: Internal Medicine

## 2014-02-17 ENCOUNTER — Other Ambulatory Visit: Payer: Self-pay | Admitting: Internal Medicine

## 2014-02-17 VITALS — BP 110/64 | HR 76 | Ht 66.0 in | Wt 154.4 lb

## 2014-02-17 DIAGNOSIS — J310 Chronic rhinitis: Secondary | ICD-10-CM

## 2014-02-17 DIAGNOSIS — Z23 Encounter for immunization: Secondary | ICD-10-CM

## 2014-02-17 DIAGNOSIS — J432 Centrilobular emphysema: Secondary | ICD-10-CM

## 2014-02-17 DIAGNOSIS — J441 Chronic obstructive pulmonary disease with (acute) exacerbation: Secondary | ICD-10-CM

## 2014-02-17 MED ORDER — ALBUTEROL SULFATE 108 (90 BASE) MCG/ACT IN AEPB: 2.0000 | INHALATION_SPRAY | Freq: Four times a day (QID) | RESPIRATORY_TRACT | Status: DC | PRN

## 2014-02-17 MED ORDER — IPRATROPIUM BROMIDE 0.03 % NA SOLN: NASAL | Status: DC

## 2014-02-17 NOTE — Progress Notes (Signed)
02/21/11- [age] yoF former smoker followed for COPD, rhinitis LOV- 02/22/10 Husband here. Had flu shot. Since last here has had a few episodes of shortness of breath, felt as difficulty taking in her breath, without wheeze or cough. Husband's rescue inhaler has helped with the use. This feeling is more common in summer and fall but not positional. She occasionally wakes at night with throat tickle but this shortness of breath sensation is also seen in the daytime and that work. 4 rhinitis, ipratropium nasal spray has been good but it doesn't seem to be lasting as long as it did. Mostly she has bothersome rhinorrhea without a lot of sneeze or congestion. Today she feels "fine".  02/20/12- [age] yoF former smoker followed for COPD, rhinitis complicated by hx GERD/ Barrett FOLLOWS FOR: no more than usual SOB, denies any wheezing, cough, or congestion at this time Had flu vax. Hx red arm local reaction to pneumovax in past. Denies breathing problems- no cough or wheeze. Has symptomatic reflux on Dexilant- known GERD/ Barrett's- Dr Chales Ab[facility]Gu[facility] [place]. Brother Jodene NamRicky In[place] sees Dr Delford FieldWright here for bullous emphysema.   02/18/13- [age] yoF former smoker followed for COPD/ E, rhinitis complicated by hx GERD/ Barrett FOLLOWS FOR: States her breathing has been okay(back at base line); review PFT with patient. PFT- Conclusions: Although there is airway obstruction and a diffusion defect suggesting emphysema, the absence of overinflation is inconsistent with that diagnosis. Pulmonary Function Diagnosis: Minimal Obstructive Airways Disease with insignificant response to bronchodilator Mild Diffusion Defect a1AT 02/19/13- was normal MM CXR 02/20/12 IMPRESSION:  Hyperinflation/COPD; no acute findings.  Original Report Authenticated By: Jeronimo GreavesKyle Talbot, M.D.  02/17/14- 89 yoF former smoker followed for COPD, rhinitis complicated by hx GERD/ Barrett's                  Husband here FOLLOW FOR:  COPD; overall breathing doing  well; since October she has had a faint wheezing noise, nose swelling  For a little wheezing. Right nostril has itched and been stuffy with a few spots of blood in recent days. Using daily Atrovent 0.03% nasal spray  ROS-see HPI Constitutional:   No-   weight loss, night sweats, fevers, chills, fatigue, lassitude. HEENT:   No-  headaches, difficulty swallowing, tooth/dental problems, sore throat,       No-  sneezing, itching, ear ache, + nasal congestion,+ post nasal drip,  CV:  No-   chest pain, orthopnea, PND, swelling in lower extremities, anasarca, dizziness, palpitations Resp: +Per HPI- shortness of breath with exertion or at rest.              No-   productive cough,  No non-productive cough,  No- coughing up of blood.              No-   change in color of mucus.  + wheezing.   Skin: No-   rash or lesions. GI:  + heartburn, indigestion, No-abdominal pain, nausea, vomiting,  GU:  MS:  No-   joint pain or swelling.   Neuro-     nothing unusual Psych:  No- change in mood or affect. No depression or anxiety.  No memory loss.  OBJ General- Alert, Oriented, Affect-appropriate, Distress- none acute; well-appearing Skin- rash-none, lesions- none, excoriation- none Lymphadenopathy- none Head- atraumatic            Eyes- Gross vision intact, PERRLA, conjunctivae clear secretions            Ears- Hearing, canals-normal  Nose- R nasal mucosa red/ irritated/scant blood, no-Septal dev,  polyps,                                        erosion, perforation             Throat- Mallampati II , mucosa clear , drainage- none, tonsils- atrophic Neck- flexible , trachea midline, no stridor , thyroid nl, carotid no bruit Chest - symmetrical excursion , unlabored           Heart/CV- RRR , no murmur , no gallop  , no rub, nl s1 s2                           - JVD- none , edema- none, stasis changes- none, varices- none           Lung- clear to P&A/ distant, wheeze- none, cough- none ,  dullness-none, rub- none           Chest wall-  Abd-  Br/ Gen/ Rectal- Not done, not indicated Extrem- cyanosis- none, clubbing, none, atrophy- none, strength- nl Neuro- grossly intact to observation

## 2014-02-17 NOTE — Patient Instructions (Addendum)
Script sent for atrovent/ ipratropium nasal spray  Consider trying the saline nasal gel as needed for dryness and irritation in your nose  Flu vax  Order- CXR    Dx COPD with exacerbation  Card and script for Proair Respiclick

## 2014-02-18 ENCOUNTER — Telehealth: Payer: Self-pay | Admitting: *Deleted

## 2014-02-18 MED ORDER — ESTRADIOL 0.1 MG/24HR TD PTTW: 1.0000 | MEDICATED_PATCH | TRANSDERMAL | Status: DC

## 2014-02-18 NOTE — Telephone Encounter (Signed)
Pt called requesting Vivelle dot patch 0.1 mg resent to pharmacy from July. I will resend

## 2014-02-20 NOTE — Assessment & Plan Note (Signed)
Mild obstructive airways disease demonstrated on PFT Plan-chest x-ray for update

## 2014-02-20 NOTE — Assessment & Plan Note (Signed)
Nasal mucosa looks irritated mainly on the right side. We discussed overdrying and suggested she use the Atrovent less often. Try nasal saline spray or gel

## 2014-02-23 ENCOUNTER — Telehealth: Payer: Self-pay

## 2014-02-23 ENCOUNTER — Other Ambulatory Visit: Payer: Self-pay | Admitting: Gynecology

## 2014-02-23 MED ORDER — ESTRADIOL 0.1 MG/24HR TD PTTW: 1.0000 | MEDICATED_PATCH | TRANSDERMAL | Status: DC

## 2014-02-23 NOTE — Telephone Encounter (Signed)
Patient currently has Rx for Alora 0.1 mg patch which is generic for Vivelle Dot 0.1 mg patch.  Pharmacy sent a note stating "Patient requests Minivelle 0.1 mg patch. If appropriate please send new Rx."  Ok?

## 2014-02-23 NOTE — Telephone Encounter (Signed)
That we will work thank you

## 2014-02-23 NOTE — Telephone Encounter (Signed)
Rx sent 

## 2014-02-26 ENCOUNTER — Ambulatory Visit: Payer: Managed Care, Other (non HMO) | Admitting: Cardiovascular Disease

## 2014-03-02 ENCOUNTER — Encounter: Payer: Self-pay | Admitting: Cardiovascular Disease

## 2014-03-02 ENCOUNTER — Ambulatory Visit (INDEPENDENT_AMBULATORY_CARE_PROVIDER_SITE_OTHER): Payer: Managed Care, Other (non HMO) | Admitting: Cardiovascular Disease

## 2014-03-02 VITALS — BP 110/70 | HR 62 | Resp 16 | Ht 66.0 in | Wt 153.9 lb

## 2014-03-02 DIAGNOSIS — J432 Centrilobular emphysema: Secondary | ICD-10-CM

## 2014-03-02 DIAGNOSIS — E7800 Pure hypercholesterolemia, unspecified: Secondary | ICD-10-CM

## 2014-03-02 DIAGNOSIS — R002 Palpitations: Secondary | ICD-10-CM

## 2014-03-02 NOTE — Progress Notes (Signed)
Patient ID: Cheyenne Gallegos, female   DOB: 1955-07-30, 58 y.o.   MRN: [id number]     Reason for office visit Hyperlipidemia, palpitations   Cheyenne Gallegos is still troubled by frequent rapid and regular palpitations, that she feels is a heart beating in her throat. They occur randomly and have very abrupt onset and termination. They generally happen less than monthly and only last for 2-3 minutes at a time.   She does not have known structural heart disease but does have hyperlipidemia (on statin). She does not have known structural heart disease. She had a normal nuclear stress test in September of 2011. Left ventricular systolic function was normal with an EF of 62%. She does not have chest pain or shortness of breath but is troubled by occasional palpitations, maybe occurring once a month. These generally occur at rest. Sometimes they occur in this short bursts, never longer than a minute. She works out at [employer] one to 3 days a week for a total of about an hour spending half of it on the treadmill and half on the weight machines. She never has trouble while exercising. She has mild COPD and Barretts esophagus.   Allergies  Allergen Reactions  . Morphine Nausea And Vomiting    Current Outpatient Prescriptions  Medication Sig Dispense Refill  . calcium carbonate (OS-CAL) 600 MG TABS Take 600 mg by mouth 2 (two) times daily with a meal.    . Cetirizine HCl (ZYRTEC PO) Take 1 tablet by mouth daily.     Marland Kitchen. dexlansoprazole (DEXILANT) 60 MG capsule Take 60 mg by mouth daily.      Marland Kitchen. estradiol (MINIVELLE) 0.1 MG/24HR patch Place 1 patch (0.1 mg total) onto the skin 2 (two) times a week. 8 patch 9  . ipratropium (ATROVENT) 0.06 % nasal spray Place 2 sprays into the nose daily as needed.  99  . Multiple Vitamins-Minerals (AIRBORNE) CHEW Chew by mouth.    . Multiple Vitamins-Minerals (MULTIVITAMIN WITH MINERALS) tablet Take 1 tablet by mouth daily.    . Probiotic Product (PROBIOTIC DAILY PO) Take 1  tablet by mouth daily.    . simvastatin (ZOCOR) 40 MG tablet Take 1 tablet (40 mg total) by mouth every evening. 30 tablet 11  . vitamin C (ASCORBIC ACID) 500 MG tablet Take 500 mg by mouth daily.    . vitamin E 1000 UNIT capsule Take 400 Units by mouth daily.      No current facility-administered medications for this visit.    Past Medical History  Diagnosis Date  . Nausea   . Normal spontaneous vaginal delivery   . VIN III (vulvar intraepithelial neoplasia III)   . Condyloma     vulvar  . 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 Osteopenia   . GERD (gastroesophageal reflux disease)   . Barretts esophagus   . Insomnia   . ADD (attention deficit disorder with hyperactivity)   . Vitamin D deficiency   . Hypercholesteremia     Past Surgical History  Procedure Laterality Date  . Total vaginal hysterectomy  1995  . Foot surgery  2001  . Tubal ligation    . Vulva surgery    . Co2 laser of labia    . Breast biopsy  40,98,11,9179,83,80,86  . Cardiovascular stress test  12/17/2009    Images showed normal pattern of perfusion in all regions. EKG is negative for ischemia.    Family History  Problem Relation Age of  Onset  . Hypertension Mother   . Diabetes Mother   . Heart disease Mother   . COPD Brother   . Colon cancer Paternal Grandmother   . Chronic Renal Failure Mother   . Arrhythmia Mother     Atrial Fibrillation  . Heart failure Maternal Grandmother   . Heart attack Maternal Grandfather   . Cancer Paternal Grandmother     Colon cancer  . Lung disease Brother     History   Social History  . Marital Status: Married    Spouse Name: N/A    Number of Children: N/A  . Years of Education: N/A   Occupational History  . superivsor at [employer]    Social History Main Topics  . Smoking status: Former Smoker -- 1.50 packs/day for 28 years    Types: Cigarettes    Quit date: 02/05/2000  . Smokeless tobacco: Not on file  . Alcohol Use: Yes     Comment: rare  .  Drug Use: Yes  . Sexual Activity: Not on file   Other Topics Concern  . Not on file   Social History Narrative    Review of systems: The patient specifically denies any chest pain at rest or with exertion, dyspnea at rest or with exertion, orthopnea, paroxysmal nocturnal dyspnea, syncope, focal neurological deficits, intermittent claudication, lower extremity edema, unexplained weight gain, cough, hemoptysis or wheezing.  The patient also denies abdominal pain, nausea, vomiting, dysphagia, diarrhea, constipation, polyuria, polydipsia, dysuria, hematuria, frequency, urgency, abnormal bleeding or bruising, fever, chills, unexpected weight changes, mood swings, change in skin or hair texture, change in voice quality, auditory or visual problems, allergic reactions or rashes, new musculoskeletal complaints other than usual "aches and pains".   PHYSICAL EXAM BP 110/70 mmHg  Pulse 62  Resp 16  Ht 5\' 6"  (1.676 m)  Wt 153 lb 14.4 oz (69.809 kg)  BMI 24.85 kg/m2  General: Alert, oriented x3, no distress Head: no evidence of trauma, PERRL, EOMI, no exophtalmos or lid lag, no myxedema, no xanthelasma; normal ears, nose and oropharynx Neck: normal jugular venous pulsations and no hepatojugular reflux; brisk carotid pulses without delay and no carotid bruits Chest: clear to auscultation, no signs of consolidation by percussion or palpation, normal fremitus, symmetrical and full respiratory excursions Cardiovascular: normal position and quality of the apical impulse, regular rhythm, normal first and second heart sounds, no murmurs, rubs or gallops Abdomen: no tenderness or distention, no masses by palpation, no abnormal pulsatility or arterial bruits, normal bowel sounds, no hepatosplenomegaly Extremities: no clubbing, cyanosis or edema; 2+ radial, ulnar and brachial pulses bilaterally; 2+ right femoral, posterior tibial and dorsalis pedis pulses; 2+ left femoral, posterior tibial and dorsalis pedis  pulses; no subclavian or femoral bruits Neurological: grossly nonfocal  EKG: NSR  Lipid Panel     Component Value Date/Time   CHOL 146 10/21/2013 0914   TRIG 62 10/21/2013 0914   HDL 57 10/21/2013 0914   CHOLHDL 2.6 10/21/2013 0914   VLDL 12 10/21/2013 0914   LDLCALC 77 10/21/2013 0914    BMET    Component Value Date/Time   NA 139 10/21/2013 0914   K 4.0 10/21/2013 0914   CL 103 10/21/2013 0914   CO2 29 10/21/2013 0914   GLUCOSE 85 10/21/2013 0914   BUN 22 10/21/2013 0914   CREATININE 0.90 10/21/2013 0914   CREATININE 0.78 09/30/2011 1658   CALCIUM 9.0 10/21/2013 0914   GFRNONAA >90 09/30/2011 1658   GFRAA >90 09/30/2011 1658  ASSESSMENT AND PLAN  Palpitations By her description these sound benign, even more so since they're occurring in an otherwise healthy heart. They sound more sustained today, but remained very infrequent and will be hard to catch on a monitor. Described the Valsalva maneuver to see if that would help. Specific therapy does not appear to be justified.  Hypercholesterolemia Satisfactory lipid parameters on her medications. No adjustments are planned. Notes that she had myalgia while taking Vytorin but seems to tolerate simple simvastatin without problems.   Orders Placed This Encounter  Procedures  . EKG 12-Lead   Meds ordered this encounter  Medications  . ipratropium (ATROVENT) 0.06 % nasal spray    Sig: Place 2 sprays into the nose daily as needed.    Refill:  [address], [place], [facility] [phone] office [phone] pager

## 2014-03-02 NOTE — Patient Instructions (Signed)
Dr. Croitoru recommends that you schedule a follow-up appointment in: One Year.   

## 2014-03-31 ENCOUNTER — Encounter: Payer: Self-pay | Admitting: Cardiovascular Disease

## 2014-03-31 MED ORDER — SIMVASTATIN 40 MG PO TABS: 40.0000 mg | ORAL_TABLET | Freq: Every evening | ORAL | Status: DC

## 2014-03-31 NOTE — Addendum Note (Signed)
Addended by: Freddi StarrMATHIS, Charmelle Soh W on: 03/31/2014 07:11 AM   Modules accepted: Orders

## 2014-04-02 ENCOUNTER — Other Ambulatory Visit: Payer: Self-pay | Admitting: Cardiovascular Disease

## 2014-04-02 NOTE — Telephone Encounter (Signed)
Rx(s) sent to pharmacy electronically.  

## 2014-04-07 NOTE — Addendum Note (Signed)
Addended by: Lindell SparELKINS, Jatavius Ellenwood M on: 04/07/2014 09:09 AM   Modules accepted: Orders, Medications

## 2014-04-07 NOTE — Telephone Encounter (Signed)
Called pharmacy to confirm that Rx was received. They have Rx for simvastatin and it is ready for patient to pick up.

## 2014-04-28 HISTORY — PX: HAND SURGERY: SHX662

## 2014-10-20 ENCOUNTER — Other Ambulatory Visit: Payer: Self-pay

## 2014-10-20 DIAGNOSIS — Z1231 Encounter for screening mammogram for malignant neoplasm of breast: Secondary | ICD-10-CM

## 2014-11-02 ENCOUNTER — Other Ambulatory Visit (HOSPITAL_COMMUNITY)
Admission: RE | Admit: 2014-11-02 | Discharge: 2014-11-02 | Disposition: A | Discharge status: Home / Self Care | Payer: Managed Care, Other (non HMO) | Source: Ambulatory Visit | Attending: Gynecology | Admitting: Gynecology

## 2014-11-02 ENCOUNTER — Encounter: Payer: Self-pay | Admitting: Gynecology

## 2014-11-02 ENCOUNTER — Ambulatory Visit (INDEPENDENT_AMBULATORY_CARE_PROVIDER_SITE_OTHER): Payer: Managed Care, Other (non HMO) | Admitting: Gynecology

## 2014-11-02 VITALS — BP 134/80 | Ht 66.25 in | Wt 154.0 lb

## 2014-11-02 DIAGNOSIS — Z8639 Personal history of other endocrine, nutritional and metabolic disease: Secondary | ICD-10-CM | POA: Diagnosis not present

## 2014-11-02 DIAGNOSIS — Z01419 Encounter for gynecological examination (general) (routine) without abnormal findings: Secondary | ICD-10-CM | POA: Diagnosis not present

## 2014-11-02 LAB — CBC WITH DIFFERENTIAL/PLATELET
Basophils Absolute: 0 10*3/uL (ref 0.0–0.1)
Basophils Relative: 0 % (ref 0–1)
EOS ABS: 0.1 10*3/uL (ref 0.0–0.7)
Eosinophils Relative: 3 % (ref 0–5)
HEMATOCRIT: 38.7 % (ref 36.0–46.0)
Hemoglobin: 12.8 g/dL (ref 12.0–15.0)
Lymphocytes Relative: 31 % (ref 12–46)
Lymphs Abs: 1.3 10*3/uL (ref 0.7–4.0)
MCH: 30.8 pg (ref 26.0–34.0)
MCHC: 33.1 g/dL (ref 30.0–36.0)
MCV: 93 fL (ref 78.0–100.0)
MONOS PCT: 10 % (ref 3–12)
MPV: 9.1 fL (ref 8.6–12.4)
Monocytes Absolute: 0.4 10*3/uL (ref 0.1–1.0)
NEUTROS PCT: 56 % (ref 43–77)
Neutro Abs: 2.3 10*3/uL (ref 1.7–7.7)
Platelets: 257 10*3/uL (ref 150–400)
RBC: 4.16 MIL/uL (ref 3.87–5.11)
RDW: 12.8 % (ref 11.5–15.5)
WBC: 4.1 10*3/uL (ref 4.0–10.5)

## 2014-11-02 LAB — LIPID PANEL
Cholesterol: 146 mg/dL (ref 125–200)
HDL: 49 mg/dL (ref >=46)
LDL CALC: 73 mg/dL (ref <130)
Total CHOL/HDL Ratio: 3 Ratio (ref <=5.0)
Triglycerides: 120 mg/dL (ref <150)
VLDL: 24 mg/dL (ref <30)

## 2014-11-02 LAB — COMPREHENSIVE METABOLIC PANEL
ALK PHOS: 48 U/L (ref 33–130)
ALT: 13 U/L (ref 6–29)
AST: 20 U/L (ref 10–35)
Albumin: 4.1 g/dL (ref 3.6–5.1)
BUN: 19 mg/dL (ref 7–25)
CALCIUM: 9 mg/dL (ref 8.6–10.4)
CHLORIDE: 105 mmol/L (ref 98–110)
CO2: 26 mmol/L (ref 20–31)
Creat: 0.96 mg/dL (ref 0.50–1.05)
Glucose, Bld: 85 mg/dL (ref 65–99)
POTASSIUM: 4 mmol/L (ref 3.5–5.3)
Sodium: 140 mmol/L (ref 135–146)
TOTAL PROTEIN: 6.2 g/dL (ref 6.1–8.1)
Total Bilirubin: 0.5 mg/dL (ref 0.2–1.2)

## 2014-11-02 LAB — TSH: TSH: 1.923 u[IU]/mL (ref 0.350–4.500)

## 2014-11-02 MED ORDER — ESTRADIOL 0.1 MG/24HR TD PTTW: 1.0000 | MEDICATED_PATCH | TRANSDERMAL | Status: DC

## 2014-11-02 NOTE — Progress Notes (Signed)
Cheyenne Gallegos October 21, 1955 [id number]   History:    59 y.o.  for annual gyn exam with complaint of weight gain and tiredness.Patient has had several breast biopsies on the right breast in 1979, 1980, 1983 and 1986 all benign. The patient has history of colon polyp in 2007 and 2 years ago she had a normal colonoscopy.  Review of her record indicated she had severe dysplasia and LEEP cervical conization several years ago. In 1995 she had a transvaginal hysterectomy for dysfunction uterine bleeding and for dysplasia. 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 Pap smear 2014 demonstrated atypical squamous cells of undetermined significance negative HPV  Patient has had past history vitamin D deficiency and is currently on calcium and vitamin D. Her cardiologist is treating her for hypertension and hypercholesterolemia.She has had history of Barrett's esophagitis and has been followed by Dr. Loreta Ave gastroenterologist.   The patient currently doing well on Vivelle-Dot 0.1 mg transdermal patch which she applied twice a week. Last bone density study in 2014 with the lowest T. score at the left femoral neck -1.7. Patient had been on Evista and had discontinued because of side effects.  Past medical history,surgical history, family history and social history were all reviewed and documented in the EPIC chart.  Gynecologic History No LMP recorded. Patient has had a hysterectomy. Contraception: status post hysterectomy Last Pap: 2015. Results were: normal Last mammogram: 2015. Results were: Normal but dense had three-dimensional mammogram  Obstetric History OB History  Gravida Para Term Preterm AB SAB TAB Ectopic Multiple Living  1 1 1       1     # Outcome Date GA Lbr Len/2nd Weight Sex Delivery Anes PTL Lv  1 Term     M Vag-Spont  N Y       ROS: A ROS was performed and pertinent positives and  negatives are included in the history.  GENERAL: No fevers or chills. HEENT: No change in vision, no earache, sore throat or sinus congestion. NECK: No pain or stiffness. CARDIOVASCULAR: No chest pain or pressure. No palpitations. PULMONARY: No shortness of breath, cough or wheeze. GASTROINTESTINAL: No abdominal pain, nausea, vomiting or diarrhea, melena or bright red blood per rectum. GENITOURINARY: No urinary frequency, urgency, hesitancy or dysuria. MUSCULOSKELETAL: No joint or muscle pain, no back pain, no recent trauma. DERMATOLOGIC: No rash, no itching, no lesions. ENDOCRINE: No polyuria, polydipsia, no heat or cold intolerance. No recent change in weight. HEMATOLOGICAL: No anemia or easy bruising or bleeding. NEUROLOGIC: No headache, seizures, numbness, tingling or weakness. PSYCHIATRIC: No depression, no loss of interest in normal activity or change in sleep pattern.     Exam: chaperone present  BP 134/80 mmHg  Ht 5' 6.25" (1.683 m)  Wt 154 lb (69.854 kg)  BMI 24.66 kg/m2  Body mass index is 24.66 kg/(m^2).  General appearance : Well developed well nourished female. No acute distress HEENT: Eyes: no retinal hemorrhage or exudates,  Neck supple, trachea midline, no carotid bruits, no thyroidmegaly Lungs: Clear to auscultation, no rhonchi or wheezes, or rib retractions  Heart: Regular rate and rhythm, no murmurs or gallops Breast:Examined in sitting and supine position were symmetrical in appearance, no palpable masses or tenderness,  no skin retraction, no nipple inversion, no nipple discharge, no skin discoloration, no axillary or supraclavicular lymphadenopathy Abdomen: no palpable masses or tenderness, no  rebound or guarding Extremities: no edema or skin discoloration or tenderness  Pelvic:  Bartholin, Urethra, Skene Glands: Within normal limits             Vagina: No gross lesions or discharge  Cervix: Absent  Uterus  absent  Adnexa  Without masses or tenderness  Anus and  perineum  normal   Rectovaginal  normal sphincter tone without palpated masses or tenderness             Hemoccult PCP will provide     Assessment/Plan:  59 y.o. female for annual exam menopausal with vaginal atrophy and vasomotor symptoms doing well with minivelle 0.1 milligrams transdermal patch twice a week. The following labs ordered today: CBC, fasting lipid profile, comprehensive metabolic panel, TSH, and urinalysis. Pap smear was done today. Patient with past history of VIN ! And VIN III in the past no lesions seen externally today. Will continue to do annual Pap smears. We discussed importance of calcium vitamin D and regular exercise for osteoporosis prevention. We will also check her vitamin D level today. We will repeat bone density study next year. Mammogram scheduled for next month.  Ok Edwards MD, 9:12 AM 11/02/2014

## 2014-11-03 ENCOUNTER — Telehealth: Payer: Self-pay

## 2014-11-03 ENCOUNTER — Other Ambulatory Visit: Payer: Self-pay | Admitting: Gynecology

## 2014-11-03 DIAGNOSIS — E559 Vitamin D deficiency, unspecified: Secondary | ICD-10-CM

## 2014-11-03 LAB — URINALYSIS W MICROSCOPIC + REFLEX CULTURE
BACTERIA UA: NONE SEEN [HPF]
Bilirubin Urine: NEGATIVE
CRYSTALS: NONE SEEN [HPF]
Casts: NONE SEEN [LPF]
Glucose, UA: NEGATIVE
Hgb urine dipstick: NEGATIVE
Ketones, ur: NEGATIVE
LEUKOCYTES UA: NEGATIVE
NITRITE: NEGATIVE
PH: 6 (ref 5.0–8.0)
PROTEIN: NEGATIVE
Specific Gravity, Urine: 1.023 (ref 1.001–1.035)
Squamous Epithelial / LPF: NONE SEEN [HPF] (ref <=5)
WBC UA: NONE SEEN WBC/HPF (ref <=5)
Yeast: NONE SEEN [HPF]

## 2014-11-03 LAB — VITAMIN D 25 HYDROXY (VIT D DEFICIENCY, FRACTURES): VIT D 25 HYDROXY: 25 ng/mL — AB (ref 30–100)

## 2014-11-03 MED ORDER — VITAMIN D (ERGOCALCIFEROL) 1.25 MG (50000 UNIT) PO CAPS: ORAL_CAPSULE | ORAL | Status: DC

## 2014-11-03 MED ORDER — VITAMIN D (ERGOCALCIFEROL) 1.25 MG (50000 UNIT) PO CAPS: 50000.0000 [IU] | ORAL_CAPSULE | ORAL | Status: DC

## 2014-11-03 NOTE — Telephone Encounter (Signed)
Dr. Glenetta Hew- See note below. Patient is currently taking 1500 of Calcium daily and taking 2000 Vit D 3.  She is concerned after finishing prescription Vitamin D that if she goes back to the same thing she has been doing it will fall off again. Rec?

## 2014-11-03 NOTE — Telephone Encounter (Signed)
How long should I prescribed the every other week Vit D 50000 for?

## 2014-11-03 NOTE — Telephone Encounter (Signed)
Listen clearly: Stop Oscal now. Take Vit D 50,000 units q weekly for 12 weeks then come to office for vitamin D level THEN:Take Vit D 50,000 units every other week for the next few YEARS. She does not need to start oscal just Calcium 600 daily

## 2014-11-03 NOTE — Telephone Encounter (Signed)
-----   Message from Ok Edwards, MD sent at 11/03/2014  8:48 AM EDT ----- Please tell Cheyenne Gallegos her vitamin D level was low. I want her to take vitamin D 50,000 units every weekly for 12 weeks then return to the office to check her vitamin D level after 3 months. After the 3 months her up I would like her to take orally 1 Os-Cal daily along with vitamin D3 2000 units daily thereafter. While she is taking the 50,000 units that she can stop the Os-Cal for now

## 2014-11-03 NOTE — Telephone Encounter (Signed)
Follow with the plan I recommended on previous note and then while we will do is keep her on 50,000 units every other week and not take the 2000 units daily but only take calcium 600 mg daily

## 2014-11-03 NOTE — Telephone Encounter (Signed)
I reviewed these instructions with patient. Rx sent for the every other week Rx.

## 2014-11-04 LAB — URINE CULTURE

## 2014-11-04 LAB — CYTOLOGY - PAP

## 2014-11-26 ENCOUNTER — Ambulatory Visit
Admission: RE | Admit: 2014-11-26 | Discharge: 2014-11-26 | Disposition: A | Discharge status: Home / Self Care | Payer: Managed Care, Other (non HMO) | Source: Ambulatory Visit

## 2014-11-26 DIAGNOSIS — Z1231 Encounter for screening mammogram for malignant neoplasm of breast: Secondary | ICD-10-CM

## 2015-02-26 ENCOUNTER — Ambulatory Visit: Payer: Managed Care, Other (non HMO) | Admitting: Cardiovascular Disease

## 2015-03-17 ENCOUNTER — Other Ambulatory Visit: Payer: Self-pay | Admitting: Internal Medicine

## 2015-03-17 ENCOUNTER — Other Ambulatory Visit: Payer: Self-pay | Admitting: Cardiovascular Disease

## 2015-04-23 ENCOUNTER — Encounter: Payer: Self-pay | Admitting: Internal Medicine

## 2015-04-23 ENCOUNTER — Ambulatory Visit (INDEPENDENT_AMBULATORY_CARE_PROVIDER_SITE_OTHER): Payer: Managed Care, Other (non HMO) | Admitting: Cardiovascular Disease

## 2015-04-23 ENCOUNTER — Ambulatory Visit (INDEPENDENT_AMBULATORY_CARE_PROVIDER_SITE_OTHER): Payer: Managed Care, Other (non HMO) | Admitting: Internal Medicine

## 2015-04-23 ENCOUNTER — Encounter: Payer: Self-pay | Admitting: Cardiovascular Disease

## 2015-04-23 VITALS — BP 114/62 | HR 66 | Ht 66.0 in | Wt 152.4 lb

## 2015-04-23 VITALS — BP 118/78 | HR 58 | Ht 66.25 in | Wt 153.0 lb

## 2015-04-23 DIAGNOSIS — R002 Palpitations: Secondary | ICD-10-CM

## 2015-04-23 DIAGNOSIS — J310 Chronic rhinitis: Secondary | ICD-10-CM

## 2015-04-23 DIAGNOSIS — E7800 Pure hypercholesterolemia, unspecified: Secondary | ICD-10-CM

## 2015-04-23 DIAGNOSIS — I10 Essential (primary) hypertension: Secondary | ICD-10-CM

## 2015-04-23 MED ORDER — ALBUTEROL SULFATE 108 (90 BASE) MCG/ACT IN AEPB: 2.0000 | INHALATION_SPRAY | Freq: Four times a day (QID) | RESPIRATORY_TRACT | Status: DC | PRN

## 2015-04-23 MED ORDER — SIMVASTATIN 40 MG PO TABS: 40.0000 mg | ORAL_TABLET | Freq: Every evening | ORAL | Status: DC

## 2015-04-23 MED ORDER — IPRATROPIUM BROMIDE 0.03 % NA SOLN: NASAL | Status: DC

## 2015-04-23 NOTE — Progress Notes (Signed)
02/21/11- 66 yoF former smoker followed for COPD, rhinitis LOV- 02/22/10 Husband here. Had flu shot. Since last here has had a few episodes of shortness of breath, felt as difficulty taking in her breath, without wheeze or cough. Husband's rescue inhaler has helped with the use. This feeling is more common in summer and fall but not positional. She occasionally wakes at night with throat tickle but this shortness of breath sensation is also seen in the daytime and that work. 4 rhinitis, ipratropium nasal spray has been good but it doesn't seem to be lasting as long as it did. Mostly she has bothersome rhinorrhea without a lot of sneeze or congestion. Today she feels "fine".  02/20/12- 77 yoF former smoker followed for COPD, rhinitis complicated by hx GERD/ Barrett FOLLOWS FOR: no more than usual SOB, denies any wheezing, cough, or congestion at this time Had flu vax. Hx red arm local reaction to pneumovax in past. Denies breathing problems- no cough or wheeze. Has symptomatic reflux on Dexilant- known GERD/ Barrett's- Dr Chales Abrah[facility]. Brother [name] sees Dr Delford Field here for bullous emphysema.   02/18/13- 35 yoF former smoker followed for COPD/ E, rhinitis complicated by hx GERD/ Barrett FOLLOWS FOR: States her breathing has been okay(back at base line); review PFT with patient. PFT- Conclusions: Although there is airway obstruction and a diffusion defect suggesting emphysema, the absence of overinflation is inconsistent with that diagnosis. Pulmonary Function Diagnosis: Minimal Obstructive Airways Disease with insignificant response to bronchodilator Mild Diffusion Defect a1AT 02/19/13- was normal MM CXR 02/20/12 IMPRESSION:  Hyperinflation/COPD; no acute findings.  Original Report Authenticated By: Jeronimo Greaves, M.D.  02/17/14- 32 yoF former smoker followed for COPD, rhinitis complicated by hx GERD/ Barrett's                  Husband here FOLLOW FOR:  COPD; overall breathing doing  well; since October she has had a faint wheezing noise, nose swelling  For a little wheezing. Right nostril has itched and been stuffy with a few spots of blood in recent days. Using daily Atrovent 0.03% nasal spray  04/23/2015-60 year old female former smoker followed for COPD, rhinitis, complicated by GERD/Barrett's FOLLOWS FOR: Pt states she is doing well. Would like to get more exercise. Here with husband Easier dyspnea on exertion over the last few months which she associates with "no exercise". Minor wheezing at times. No acute events. We discussed trial of a rescue inhaler sample. CXR 02/18/2014 Findings consistent with chronic obstructive pulmonary disease. No acute cardiopulmonary abnormality seen  ROS-see HPI Constitutional:   No-   weight loss, night sweats, fevers, chills, fatigue, lassitude. HEENT:   No-  headaches, difficulty swallowing, tooth/dental problems, sore throat,       No-  sneezing, itching, ear ache, + nasal congestion,+ post nasal drip,  CV:  No-   chest pain, orthopnea, PND, swelling in lower extremities, anasarca, dizziness, palpitations Resp: +Per HPI- shortness of breath with exertion or at rest.              No-   productive cough,  No non-productive cough,  No- coughing up of blood.              No-   change in color of mucus.  + wheezing.   Skin: No-   rash or lesions. GI:  + heartburn, indigestion, No-abdominal pain, nausea, vomiting,  GU:  MS:  No-   joint pain or swelling.   Neuro-     nothing unusual  Psych:  No- change in mood or affect. No depression or anxiety.  No memory loss.  OBJ General- Alert, Oriented, Affect-appropriate, Distress- none acute; well-appearing Skin- rash-none, lesions- none, excoriation- none Lymphadenopathy- none Head- atraumatic            Eyes- Gross vision intact, PERRLA, conjunctivae clear secretions            Ears- Hearing, canals-normal            Nose-clear, no-Septal dev,  polyps,                                                              erosion, perforation             Throat- Mallampati II , mucosa clear , drainage- none, tonsils- atrophic Neck- flexible , trachea midline, no stridor , thyroid nl, carotid no bruit Chest - symmetrical excursion , unlabored           Heart/CV- RRR , no murmur , no gallop  , no rub, nl s1 s2                           - JVD- none , edema- none, stasis changes- none, varices- none           Lung- clear to P&A/ distant, wheeze- none, cough- none , dullness-none, rub- none           Chest wall-  Abd-  Br/ Gen/ Rectal- Not done, not indicated Extrem- cyanosis- none, clubbing, none, atrophy- none, strength- nl Neuro- grossly intact to observation

## 2015-04-23 NOTE — Assessment & Plan Note (Signed)
Good symptoms are controlled currently ahead of spring pollen season

## 2015-04-23 NOTE — Patient Instructions (Signed)
Dr. Croitoru recommends that you schedule a follow-up appointment in: one year   

## 2015-04-23 NOTE — Patient Instructions (Addendum)
Try sample Proair Respiclick rescue inhaler     Inhale 2 puffs every 6 hours if needed for occasional use  Please call as needed

## 2015-04-23 NOTE — Progress Notes (Signed)
Patient ID: Cheyenne Gallegos, female   DOB: [birth year], 60 y.o.   MRN: [id number]    Cardiology Office Note    Date:  04/23/2015   ID:  Cheyenne Gallegos, DOB 1955-12-04, MRN [id number]  PCP:  Hadley Pen, MD  Cardiologist:  Thurmon Fair, MD   Chief Complaint  Patient presents with  . Palpitations    History of Present Illness:  Cheyenne Gallegos is a 60 y.o. female with complaints of occasional intermittent palpitations. These are felt like isolated skipped beats. They occur when she is under emotional stress. Recently, her 77 year old father has been diagnosed with widespread metastatic cancer of the prostate and is undergoing hospice care. Her brother has serious lung and heart problems.  She denies any problems with exertional angina or exertional dyspnea, lower extremity edema, claudication, focal neurological deficits, syncope or presyncope. She still exercises at the gym for 1 hour 3 days a week.  Significant comorbid conditions include Barrett's esophagus and mild COPD. She had a normal nuclear stress test in 2011 with an ejection fraction of 62%.  Labs recently performed showed triglycerides 120, total cholesterol 148, HDL 46, LDL 78.  Past Medical History  Diagnosis Date  . Nausea   . Normal spontaneous vaginal delivery   . VIN III (vulvar intraepithelial neoplasia III)   . Condyloma     vulvar  . 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 Osteopenia   . GERD (gastroesophageal reflux disease)   . Barretts esophagus   . Insomnia   . ADD (attention deficit disorder with hyperactivity)   . Vitamin D deficiency   . Hypercholesteremia     Past Surgical History  Procedure Laterality Date  . Total vaginal hysterectomy  1995  . Foot surgery  2001  . Tubal ligation    . Vulva surgery    . Co2 laser of labia    . Breast biopsy  08,65,78,46  . Cardiovascular stress test  12/17/2009    Images showed normal pattern of perfusion in all regions.  EKG is negative for ischemia.  . Hand surgery Right Apr 28 2014    Outpatient Prescriptions Prior to Visit  Medication Sig Dispense Refill  . calcium carbonate (OS-CAL) 600 MG TABS Take 600 mg by mouth 2 (two) times daily with a meal.    . Cetirizine HCl (ZYRTEC PO) Take 1 tablet by mouth daily.     Marland Kitchen dexlansoprazole (DEXILANT) 60 MG capsule Take 60 mg by mouth daily.      Marland Kitchen estradiol (MINIVELLE) 0.1 MG/24HR patch Place 1 patch (0.1 mg total) onto the skin 2 (two) times a week. 8 patch 9  . ipratropium (ATROVENT) 0.03 % nasal spray INSTILL 1-2 SPRAYS IN EACH NOSTRIL EVERY 8 HOURS AS NEEDED 30 mL 2  . Probiotic Product (PROBIOTIC DAILY PO) Take 1 tablet by mouth daily.    . vitamin C (ASCORBIC ACID) 500 MG tablet Take 500 mg by mouth daily.    . Vitamin D, Ergocalciferol, (DRISDOL) 50000 UNITS CAPS capsule Take 1 capsule (50,000 Units total) by mouth every 7 (seven) days. 12 capsule 0  . Vitamin D, Ergocalciferol, (DRISDOL) 50000 UNITS CAPS capsule Take one cap every other week. 6 capsule 6  . Multiple Vitamins-Minerals (MULTIVITAMIN WITH MINERALS) tablet Take 1 tablet by mouth daily. Reported on 04/23/2015    . simvastatin (ZOCOR) 40 MG tablet TAKE ONE TABLET BY MOUTH IN THE EVENING 30 tablet 0  . ipratropium (ATROVENT) 0.06 %  nasal spray Place 2 sprays into the nose daily as needed. Reported on 04/23/2015  99  . Multiple Vitamins-Minerals (AIRBORNE) CHEW Chew by mouth. Reported on 04/23/2015    . vitamin E 1000 UNIT capsule Take 400 Units by mouth daily. Reported on 04/23/2015     No facility-administered medications prior to visit.     Allergies:   Morphine   Social History   Social History  . Marital Status: Married    Spouse Name: N/A  . Number of Children: N/A  . Years of Education: N/A   Occupational History  . superivsor at [employer]    Social History Main Topics  . Smoking status: Former Smoker -- 1.50 packs/day for 28 years    Types: Cigarettes    Quit date: 02/05/2000  .  Smokeless tobacco: None  . Alcohol Use: 0.0 oz/week    0 Standard drinks or equivalent per week     Comment: rare  . Drug Use: Yes  . Sexual Activity: Not Asked   Other Topics Concern  . None   Social History Narrative     Family History:  The patient's family history includes Arrhythmia in her mother; COPD in her brother; Cancer in her paternal grandmother; Chronic Renal Failure in her mother; Colon cancer in her paternal grandmother; Diabetes in her mother; Heart attack in her maternal grandfather; Heart disease in her mother; Heart failure in her maternal grandmother; Hypertension in her mother; Lung disease in her brother.   ROS:   Please see the history of present illness.    ROS All other systems reviewed and are negative.   PHYSICAL EXAM:   VS:  BP 118/78 mmHg  Pulse 58  Ht 5' 6.25" (1.683 m)  Wt 153 lb (69.4 kg)  BMI 24.50 kg/m2   GEN: Well nourished, well developed, in no acute distress HEENT: normal Neck: no JVD, carotid bruits, or masses Cardiac: RRR; no murmurs, rubs, or gallops,no edema  Respiratory:  clear to auscultation bilaterally, normal work of breathing GI: soft, nontender, nondistended, + BS MS: no deformity or atrophy Skin: warm and dry, no rash Neuro:  Alert and Oriented x 3, Strength and sensation are intact Psych: euthymic mood, full affect  Wt Readings from Last 3 Encounters:  04/23/15 153 lb (69.4 kg)  11/02/14 154 lb (69.854 kg)  03/02/14 153 lb 14.4 oz (69.809 kg)      Studies/Labs Reviewed:   EKG:  EKG is ordered today.  The ekg ordered today demonstrates mild sinus bradycardia, otherwise normal  Recent Labs: 11/02/2014: ALT 13; BUN 19; Creat 0.96; Hemoglobin 12.8; Platelets 257; Potassium 4.0; Sodium 140; TSH 1.923   Lipid Panel    Component Value Date/Time   CHOL 146 11/02/2014 0828   TRIG 120 11/02/2014 0828   HDL 49 11/02/2014 0828   CHOLHDL 3.0 11/02/2014 0828   VLDL 24 11/02/2014 0828   LDLCALC 73 11/02/2014 0828     ASSESSMENT:    1. Palpitations   2. Essential hypertension   3. Hypercholesterolemia      PLAN:  In order of problems listed above:  1. Her palpitations appear to be related to isolated PACs and/or PVCs. There are not associated with any other troublesome complaints or evidence of structural cardiac abnormalities. The palpitations never occurred during exercise. She would prefer not to take medications for them. 2. Blood pressure is well controlled 3. Excellent lipid profile on current regimen    Medication Adjustments/Labs and Tests Ordered: Current medicines are reviewed at length  with the patient today.  Concerns regarding medicines are outlined above.  Medication changes, Labs and Tests ordered today are listed in the Patient Instructions below. Patient Instructions  Dr. Royann Shivers recommends that you schedule a follow-up appointment in: one year         Signed, Thurmon Fair, MD  04/23/2015 10:03 AM    [facility] [address] [place], [place], [place]  [address] Phone: [phone]; Fax: [phone]

## 2015-04-23 NOTE — Assessment & Plan Note (Signed)
Mild COPD, no longer smoking. Exam doesn't suggest an active process. We discussed importance of regular exercise for maintaining stamina. Minimal wheezing at times so we will let her try an albuterol inhaler. Plan-sample ProAir Respiclick

## 2015-04-23 NOTE — Addendum Note (Signed)
Addended by: Ronnell Guadalajara A on: 04/23/2015 11:23 AM   Modules accepted: Orders

## 2015-07-26 ENCOUNTER — Telehealth: Payer: Self-pay | Admitting: *Deleted

## 2015-07-26 NOTE — Telephone Encounter (Signed)
Pt has some concerns regarding taking calcium, has family history of heart disease, she [facility] an [facility] that made her very nervous about continuing OTC calcium. Pt said the article mention plaque build up, states she has been taking for several years now, aware this helps for osteoporosis prevention. Please advise

## 2015-07-26 NOTE — Telephone Encounter (Signed)
Should not take more than 600 mg daily. One simple Caltrate or Os-Cal or Citracal will be fine

## 2015-07-27 NOTE — Telephone Encounter (Signed)
Pt informed with the below note. 

## 2015-08-12 ENCOUNTER — Ambulatory Visit (INDEPENDENT_AMBULATORY_CARE_PROVIDER_SITE_OTHER)
Admission: RE | Admit: 2015-08-12 | Discharge: 2015-08-12 | Disposition: A | Discharge status: Home / Self Care | Payer: Managed Care, Other (non HMO) | Source: Ambulatory Visit | Attending: Internal Medicine | Admitting: Internal Medicine

## 2015-08-12 ENCOUNTER — Encounter: Payer: Self-pay | Admitting: Internal Medicine

## 2015-08-12 ENCOUNTER — Ambulatory Visit (INDEPENDENT_AMBULATORY_CARE_PROVIDER_SITE_OTHER): Payer: Managed Care, Other (non HMO) | Admitting: Internal Medicine

## 2015-08-12 VITALS — BP 102/62 | HR 62 | Ht 66.0 in | Wt 144.6 lb

## 2015-08-12 DIAGNOSIS — J449 Chronic obstructive pulmonary disease, unspecified: Secondary | ICD-10-CM

## 2015-08-12 NOTE — Progress Notes (Signed)
02/21/11- 30 yoF former smoker followed for COPD, rhinitis LOV- 02/22/10 Husband here. Had flu shot. Since last here has had a few episodes of shortness of breath, felt as difficulty taking in her breath, without wheeze or cough. Husband's rescue inhaler has helped with the use. This feeling is more common in summer and fall but not positional. She occasionally wakes at night with throat tickle but this shortness of breath sensation is also seen in the daytime and that work. 4 rhinitis, ipratropium nasal spray has been good but it doesn't seem to be lasting as long as it did. Mostly she has bothersome rhinorrhea without a lot of sneeze or congestion. Today she feels "fine".  02/20/12- 58 yoF former smoker followed for COPD, rhinitis complicated by hx GERD/ Barrett FOLLOWS FOR: no more than usual SOB, denies any wheezing, cough, or congestion at this time Had flu vax. Hx red arm local reaction to pneumovax in past. Denies breathing problems- no cough or wheeze. Has symptomatic reflux on Dexilant- known GERD/ Barrett's- Dr Chales Abrahams [facility] [place]. Brother [name] sees Dr Delford Field here for bullous emphysema.   02/18/13- 89 yoF former smoker followed for COPD/ E, rhinitis complicated by hx GERD/ Barrett FOLLOWS FOR: States her breathing has been okay(back at base line); review PFT with patient. PFT- Conclusions: Although there is airway obstruction and a diffusion defect suggesting emphysema, the absence of overinflation is inconsistent with that diagnosis. Pulmonary Function Diagnosis: Minimal Obstructive Airways Disease with insignificant response to bronchodilator Mild Diffusion Defect a1AT 02/19/13- was normal MM CXR 02/20/12 IMPRESSION:  Hyperinflation/COPD; no acute findings.  Original Report Authenticated By: Jeronimo Greaves, M.D.  02/17/14- 62 yoF former smoker followed for COPD, rhinitis complicated by hx GERD/ Barrett's                  Husband here FOLLOW FOR:  COPD; overall breathing doing  well; since October she has had a faint wheezing noise, nose swelling  For a little wheezing. Right nostril has itched and been stuffy with a few spots of blood in recent days. Using daily Atrovent 0.03% nasal spray  04/23/2015-60 year old female former smoker followed for COPD, rhinitis, complicated by GERD/Barrett's FOLLOWS FOR: Pt states she is doing well. Would like to get more exercise. Here with husband Easier dyspnea on exertion over the last few months which she associates with "no exercise". Minor wheezing at times. No acute events. We discussed trial of a rescue inhaler sample. CXR 02/18/2014 Findings consistent with chronic obstructive pulmonary disease. No acute cardiopulmonary abnormality seen  08/12/2015-60 year old female former smoker followed for COPD, rhinitis, complicated by GERD/Barrett's FOLLOWS ZOX:WRUEAVW has severe lung disease. Positive forAlpha Antrip. Concerned and wants to discuss this a1AT 02/28/12- MM/ 148 WNL    ROS-see HPI Constitutional:   No-   weight loss, night sweats, fevers, chills, fatigue, lassitude. HEENT:   No-  headaches, difficulty swallowing, tooth/dental problems, sore throat,       No-  sneezing, itching, ear ache, + nasal congestion,+ post nasal drip,  CV:  No-   chest pain, orthopnea, PND, swelling in lower extremities, anasarca, dizziness, palpitations Resp: +Per HPI- shortness of breath with exertion or at rest.              No-   productive cough,  No non-productive cough,  No- coughing up of blood.              No-   change in color of mucus.  + wheezing.   Skin: No-  rash or lesions. GI:  + heartburn, indigestion, No-abdominal pain, nausea, vomiting,  GU:  MS:  No-   joint pain or swelling.   Neuro-     nothing unusual Psych:  No- change in mood or affect. No depression or anxiety.  No memory loss.  OBJ General- Alert, Oriented, Affect-appropriate, Distress- none acute; well-appearing Skin- rash-none, lesions- none, excoriation-  none Lymphadenopathy- none Head- atraumatic            Eyes- Gross vision intact, PERRLA, conjunctivae clear secretions            Ears- Hearing, canals-normal            Nose-clear, no-Septal dev,  polyps,                                                             erosion, perforation             Throat- Mallampati II , mucosa clear , drainage- none, tonsils- atrophic Neck- flexible , trachea midline, no stridor , thyroid nl, carotid no bruit Chest - symmetrical excursion , unlabored           Heart/CV- RRR , no murmur , no gallop  , no rub, nl s1 s2                           - JVD- none , edema- none, stasis changes- none, varices- none           Lung- clear to P&A/ distant, wheeze- none, cough- none , dullness-none, rub- none           Chest wall-  Abd-  Br/ Gen/ Rectal- Not done, not indicated Extrem- cyanosis- none, clubbing, none, atrophy- none, strength- nl Neuro- grossly intact to observation

## 2015-08-12 NOTE — Patient Instructions (Addendum)
Order- CXR     Dx COPD mixed type  Please call as needed

## 2015-08-20 ENCOUNTER — Encounter: Payer: Self-pay | Admitting: Gastroenterology

## 2015-08-20 HISTORY — PX: ESOPHAGOGASTRODUODENOSCOPY: SHX1529

## 2015-09-29 ENCOUNTER — Other Ambulatory Visit: Payer: Self-pay

## 2015-09-29 MED ORDER — ESTRADIOL 0.1 MG/24HR TD PTTW: 1.0000 | MEDICATED_PATCH | TRANSDERMAL | Status: DC

## 2015-10-11 ENCOUNTER — Ambulatory Visit: Payer: Managed Care, Other (non HMO) | Admitting: Podiatry

## 2015-10-11 ENCOUNTER — Ambulatory Visit (INDEPENDENT_AMBULATORY_CARE_PROVIDER_SITE_OTHER): Payer: Managed Care, Other (non HMO) | Admitting: Podiatry

## 2015-10-11 ENCOUNTER — Encounter: Payer: Self-pay | Admitting: Podiatry

## 2015-10-11 VITALS — BP 131/70 | HR 65 | Resp 12

## 2015-10-11 DIAGNOSIS — B351 Tinea unguium: Secondary | ICD-10-CM

## 2015-10-11 NOTE — Progress Notes (Signed)
   Subjective:    Patient ID: Cheyenne HeysPhyllis I Kluck, female    DOB: 1955/12/30, 60 y.o.   MRN: [id number]  HPI this patient presents to the office stating that the big toenail on her left foot is lifting up and has become unattached. She says that the nail has come more unattached over the last 2 months. She denies any trauma or injury or even drainage or pain from the nail site. She has been told to introduce alcohol under the nail in an effort to eliminate any fungal infection. She presents the office today for an evaluation and treatment of this nail    Review of Systems  All other systems reviewed and are negative.      Objective:   Physical Exam GENERAL APPEARANCE: Alert, conversant. Appropriately groomed. No acute distress.  VASCULAR: Pedal pulses are  palpable at  [facility] bilateral.  Capillary refill time is immediate to all digits,  Normal temperature gradient.  Digital hair growth is present bilateral  NEUROLOGIC: sensation is normal to 5.07 monofilament at 5/5 sites bilateral.  Light touch is intact bilateral, Muscle strength normal.  MUSCULOSKELETAL: acceptable muscle strength, tone and stability bilateral.  Intrinsic muscluature intact bilateral.  Rectus appearance of foot and digits noted bilateral.   DERMATOLOGIC: skin color, texture, and turgor are within normal limits.  No preulcerative lesions or ulcers  are seen, no interdigital maceration noted.  No open lesions present.   No drainage noted.  NAILS  left great toenail is unattached from nail bed.  No redness or swelling or drainage.  The nail plate itself has blackened.         Assessment & Plan:  Onychomycosis left hallux.  IE  Prescribe formula 3.  Discussed nail with patient.  We decided to allow the nail to grow and watch to see if the nail plate attached.   Helane GuntherGregory Jaymee Tilson DPM

## 2015-10-27 ENCOUNTER — Other Ambulatory Visit: Payer: Self-pay | Admitting: Gynecology

## 2015-10-27 DIAGNOSIS — Z1231 Encounter for screening mammogram for malignant neoplasm of breast: Secondary | ICD-10-CM

## 2015-11-10 ENCOUNTER — Other Ambulatory Visit: Payer: Self-pay | Admitting: Gynecology

## 2015-11-10 NOTE — Telephone Encounter (Signed)
Needs annual exam

## 2015-11-11 ENCOUNTER — Encounter: Payer: Managed Care, Other (non HMO) | Admitting: Gynecology

## 2015-11-15 ENCOUNTER — Encounter: Payer: Managed Care, Other (non HMO) | Admitting: Gynecology

## 2015-11-19 ENCOUNTER — Encounter: Payer: Managed Care, Other (non HMO) | Admitting: Gynecology

## 2015-11-22 ENCOUNTER — Other Ambulatory Visit: Payer: Self-pay | Admitting: Gynecology

## 2015-11-30 ENCOUNTER — Ambulatory Visit: Payer: Managed Care, Other (non HMO)

## 2015-12-20 ENCOUNTER — Ambulatory Visit (HOSPITAL_COMMUNITY)
Admission: RE | Admit: 2015-12-20 | Discharge: 2015-12-20 | Disposition: A | Discharge status: Home / Self Care | Payer: Managed Care, Other (non HMO) | Source: Ambulatory Visit | Attending: Gynecology | Admitting: Gynecology

## 2015-12-20 ENCOUNTER — Encounter: Payer: Self-pay | Admitting: Gynecology

## 2015-12-20 ENCOUNTER — Ambulatory Visit
Admission: RE | Admit: 2015-12-20 | Discharge: 2015-12-20 | Disposition: A | Discharge status: Home / Self Care | Payer: Managed Care, Other (non HMO) | Source: Ambulatory Visit | Attending: Gynecology | Admitting: Gynecology

## 2015-12-20 ENCOUNTER — Ambulatory Visit (INDEPENDENT_AMBULATORY_CARE_PROVIDER_SITE_OTHER): Payer: Managed Care, Other (non HMO) | Admitting: Gynecology

## 2015-12-20 VITALS — BP 120/84 | Ht 66.0 in | Wt 135.0 lb

## 2015-12-20 DIAGNOSIS — Z87412 Personal history of vulvar dysplasia: Secondary | ICD-10-CM

## 2015-12-20 DIAGNOSIS — J439 Emphysema, unspecified: Secondary | ICD-10-CM | POA: Diagnosis not present

## 2015-12-20 DIAGNOSIS — M8580 Other specified disorders of bone density and structure, unspecified site: Secondary | ICD-10-CM

## 2015-12-20 DIAGNOSIS — R05 Cough: Secondary | ICD-10-CM

## 2015-12-20 DIAGNOSIS — Z87891 Personal history of nicotine dependence: Secondary | ICD-10-CM | POA: Diagnosis not present

## 2015-12-20 DIAGNOSIS — Z79890 Hormone replacement therapy (postmenopausal): Secondary | ICD-10-CM

## 2015-12-20 DIAGNOSIS — Z8639 Personal history of other endocrine, nutritional and metabolic disease: Secondary | ICD-10-CM | POA: Diagnosis not present

## 2015-12-20 DIAGNOSIS — Z1231 Encounter for screening mammogram for malignant neoplasm of breast: Secondary | ICD-10-CM

## 2015-12-20 DIAGNOSIS — Z780 Asymptomatic menopausal state: Secondary | ICD-10-CM | POA: Diagnosis not present

## 2015-12-20 DIAGNOSIS — R059 Cough, unspecified: Secondary | ICD-10-CM

## 2015-12-20 DIAGNOSIS — Z01419 Encounter for gynecological examination (general) (routine) without abnormal findings: Secondary | ICD-10-CM | POA: Diagnosis not present

## 2015-12-20 LAB — URINALYSIS W MICROSCOPIC + REFLEX CULTURE
BACTERIA UA: NONE SEEN [HPF]
BILIRUBIN URINE: NEGATIVE
CASTS: NONE SEEN [LPF]
CRYSTALS: NONE SEEN [HPF]
Glucose, UA: NEGATIVE
Hgb urine dipstick: NEGATIVE
Ketones, ur: NEGATIVE
Leukocytes, UA: NEGATIVE
Nitrite: NEGATIVE
PROTEIN: NEGATIVE
SPECIFIC GRAVITY, URINE: 1.022 (ref 1.001–1.035)
WBC UA: NONE SEEN WBC/HPF (ref <=5)
YEAST: NONE SEEN [HPF]
pH: 5.5 (ref 5.0–8.0)

## 2015-12-20 LAB — CBC WITH DIFFERENTIAL/PLATELET
BASOS PCT: 0 %
Basophils Absolute: 0 cells/uL (ref 0–200)
EOS ABS: 114 {cells}/uL (ref 15–500)
EOS PCT: 2 %
HCT: 38 % (ref 35.0–45.0)
Hemoglobin: 12.4 g/dL (ref 11.7–15.5)
LYMPHS PCT: 20 %
Lymphs Abs: 1140 cells/uL (ref 850–3900)
MCH: 30.8 pg (ref 27.0–33.0)
MCHC: 32.6 g/dL (ref 32.0–36.0)
MCV: 94.5 fL (ref 80.0–100.0)
MONOS PCT: 10 %
MPV: 9.1 fL (ref 7.5–12.5)
Monocytes Absolute: 570 cells/uL (ref 200–950)
Neutro Abs: 3876 cells/uL (ref 1500–7800)
Neutrophils Relative %: 68 %
PLATELETS: 298 10*3/uL (ref 140–400)
RBC: 4.02 MIL/uL (ref 3.80–5.10)
RDW: 12.7 % (ref 11.0–15.0)
WBC: 5.7 10*3/uL (ref 3.8–10.8)

## 2015-12-20 LAB — COMPREHENSIVE METABOLIC PANEL
ALT: 23 U/L (ref 6–29)
AST: 20 U/L (ref 10–35)
Albumin: 3.9 g/dL (ref 3.6–5.1)
Alkaline Phosphatase: 62 U/L (ref 33–130)
BUN: 13 mg/dL (ref 7–25)
CHLORIDE: 105 mmol/L (ref 98–110)
CO2: 27 mmol/L (ref 20–31)
CREATININE: 0.86 mg/dL (ref 0.50–0.99)
Calcium: 9.2 mg/dL (ref 8.6–10.4)
GLUCOSE: 90 mg/dL (ref 65–99)
POTASSIUM: 4.3 mmol/L (ref 3.5–5.3)
SODIUM: 140 mmol/L (ref 135–146)
Total Bilirubin: 0.3 mg/dL (ref 0.2–1.2)
Total Protein: 6.3 g/dL (ref 6.1–8.1)

## 2015-12-20 LAB — LIPID PANEL
CHOL/HDL RATIO: 3.1 ratio (ref <=5.0)
Cholesterol: 122 mg/dL — ABNORMAL LOW (ref 125–200)
HDL: 40 mg/dL — ABNORMAL LOW (ref >=46)
LDL Cholesterol: 60 mg/dL (ref <130)
Triglycerides: 111 mg/dL (ref <150)
VLDL: 22 mg/dL (ref <30)

## 2015-12-20 LAB — TSH: TSH: 1.17 m[IU]/L

## 2015-12-20 MED ORDER — ESTRADIOL 0.1 MG/24HR TD PTTW: 1.0000 | MEDICATED_PATCH | TRANSDERMAL | 11 refills | Status: DC

## 2015-12-20 MED ORDER — VITAMIN D (ERGOCALCIFEROL) 1.25 MG (50000 UNIT) PO CAPS: ORAL_CAPSULE | ORAL | 11 refills | Status: DC

## 2015-12-20 NOTE — Addendum Note (Signed)
Addended by: Berna SpareASTILLO, Lincy Belles A on: 12/20/2015 10:20 AM   Modules accepted: Orders

## 2015-12-20 NOTE — Progress Notes (Signed)
Cheyenne Gallegos 1956/02/09 [id number]   History:    60 y.o.  for annual gyn exam who stated that [place] and since then has had a nonproductive cough. She denies any fever, chills, nausea, vomiting or any rash. Patient is a prior smoker and has had history of emphysema and had seen her pulmonologist Dr. Maple HudsonYoung in the past.  .Patient has had several breast biopsies on the right breast in 1979, 1980, 1983 and 1986 all benign. The patient has history of colon polyp in 2007 and 2 years ago she had a normal colonoscopy.  Review of her record indicated she had severe dysplasia and LEEP cervical conization several years ago. In 1995 she had a transvaginal hysterectomy for dysfunction uterine bleeding and for dysplasia. 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 Pap smear 2014 demonstrated atypical squamous cells of undetermined significance negative HPV  Patient has had past history vitamin D deficiency and is currently on calcium and vitamin D. Her cardiologist is treating her for hypertension and hypercholesterolemia.She has had history of Barrett's esophagitis and has been followed by Dr. Loreta AveMann gastroenterologist.   The patient currently doing well on Vivelle-Dot 0.1 mg transdermal patch which she applied twice a week. Last bone density study in 2014 with the lowest T. score at the left femoral neck -1.7. Patient had been on Evista and had discontinued because of side effects.   Past medical history,surgical history, family history and social history were all reviewed and documented in the EPIC chart.  Gynecologic History No LMP recorded. Patient has had a hysterectomy. Contraception: post menopausal status Last Pap: 2016. Results were: normal Last mammogram: 2016. Results were: Normal but dense she is having three-dimensional mammograms every year  Obstetric  History OB History  Gravida Para Term Preterm AB Living  1 1 1     1   SAB TAB Ectopic Multiple Live Births          1    # Outcome Date GA Lbr Len/2nd Weight Sex Delivery Anes PTL Lv  1 Term     M Vag-Spont  N LIV       ROS: A ROS was performed and pertinent positives and negatives are included in the history.  GENERAL: No fevers or chills. HEENT: No change in vision, no earache, sore throat or sinus congestion. NECK: No pain or stiffness. CARDIOVASCULAR: No chest pain or pressure. No palpitations. PULMONARY: Nonproductive cough. GASTROINTESTINAL: No abdominal pain, nausea, vomiting or diarrhea, melena or bright red blood per rectum. GENITOURINARY: No urinary frequency, urgency, hesitancy or dysuria. MUSCULOSKELETAL: No joint or muscle pain, no back pain, no recent trauma. DERMATOLOGIC: No rash, no itching, no lesions. ENDOCRINE: No polyuria, polydipsia, no heat or cold intolerance. No recent change in weight. HEMATOLOGICAL: No anemia or easy bruising or bleeding. NEUROLOGIC: No headache, seizures, numbness, tingling or weakness. PSYCHIATRIC: No depression, no loss of interest in normal activity or change in sleep pattern.     Exam: chaperone present  BP 120/84   Ht 5\' 6"  (1.676 m)   Wt 135 lb (61.2 kg)   BMI 21.79 kg/m   Body mass index is 21.79 kg/m.  General appearance : Well developed well nourished female. No acute distress HEENT: Eyes: no retinal hemorrhage or exudates,  Neck supple, trachea midline, no carotid bruits, no thyroidmegaly Lungs:  Clear to auscultation, no rhonchi or wheezes, or rib retractions  Heart: Regular rate and rhythm, no murmurs or gallops Breast:Examined in sitting and supine position were symmetrical in appearance, no palpable masses or tenderness,  no skin retraction, no nipple inversion, no nipple discharge, no skin discoloration, no axillary or supraclavicular lymphadenopathy Abdomen: no palpable masses or tenderness, no rebound or  guarding Extremities: no edema or skin discoloration or tenderness  Pelvic:  Bartholin, Urethra, Skene Glands: Within normal limits             Vagina: No gross lesions or discharge, atrophic changes  Cervix: Absent  Uterus  absent  Adnexa  Without masses or tenderness  Anus and perineum  normal   Rectovaginal  normal sphincter tone without palpated masses or tenderness             Hemoccult due for colonoscopy next year     Assessment/Plan:  60 y.o. female for annual exam with history vitamin D deficiency currently on 50,000 units every other week. We'll check her vitamin D level today along with the following screening blood work: Fasting lipid profile, comprehensive metabolic panel, TSH, CBC, and urinalysis. Because of patient's nonproductive cough she'll be sent to the hospital for chest x-ray PA and lateral. This may be viral induced. Ever coughing continues next week she will contact her pulmonologist for follow-up. Patient was provided with prescription refill for vitamin D as well as for her mini Bell transdermal estrogen patch. We discussed importance of weightbearing exercises. She is to schedule her bone density study here in the next few weeks as well. Pap smear done today  Ok Edwards MD, 9:45 AM 12/20/2015

## 2015-12-20 NOTE — Patient Instructions (Signed)

## 2015-12-21 LAB — PAP IG W/ RFLX HPV ASCU

## 2015-12-21 LAB — VITAMIN D 25 HYDROXY (VIT D DEFICIENCY, FRACTURES): VIT D 25 HYDROXY: 39 ng/mL (ref 30–100)

## 2015-12-22 LAB — URINE CULTURE

## 2016-01-17 ENCOUNTER — Other Ambulatory Visit: Payer: Self-pay | Admitting: Gynecology

## 2016-01-17 ENCOUNTER — Ambulatory Visit (INDEPENDENT_AMBULATORY_CARE_PROVIDER_SITE_OTHER): Payer: Managed Care, Other (non HMO)

## 2016-01-17 DIAGNOSIS — M8580 Other specified disorders of bone density and structure, unspecified site: Secondary | ICD-10-CM | POA: Diagnosis not present

## 2016-01-17 DIAGNOSIS — Z13820 Encounter for screening for osteoporosis: Secondary | ICD-10-CM

## 2016-01-17 DIAGNOSIS — Z780 Asymptomatic menopausal state: Secondary | ICD-10-CM

## 2016-04-03 ENCOUNTER — Ambulatory Visit: Payer: Managed Care, Other (non HMO) | Admitting: Cardiovascular Disease

## 2016-04-24 ENCOUNTER — Ambulatory Visit: Payer: Managed Care, Other (non HMO) | Admitting: Internal Medicine

## 2016-04-27 ENCOUNTER — Other Ambulatory Visit: Payer: Self-pay | Admitting: Cardiovascular Disease

## 2016-05-12 ENCOUNTER — Encounter: Payer: Self-pay | Admitting: Cardiovascular Disease

## 2016-05-12 ENCOUNTER — Ambulatory Visit (INDEPENDENT_AMBULATORY_CARE_PROVIDER_SITE_OTHER): Payer: Managed Care, Other (non HMO) | Admitting: Cardiovascular Disease

## 2016-05-12 VITALS — BP 122/80 | HR 57 | Ht 66.0 in | Wt 143.0 lb

## 2016-05-12 DIAGNOSIS — R002 Palpitations: Secondary | ICD-10-CM | POA: Diagnosis not present

## 2016-05-12 DIAGNOSIS — E7800 Pure hypercholesterolemia, unspecified: Secondary | ICD-10-CM

## 2016-05-12 DIAGNOSIS — I10 Essential (primary) hypertension: Secondary | ICD-10-CM | POA: Diagnosis not present

## 2016-05-12 MED ORDER — SIMVASTATIN 40 MG PO TABS: 40.0000 mg | ORAL_TABLET | Freq: Every evening | ORAL | 3 refills | Status: DC

## 2016-05-12 NOTE — Patient Instructions (Signed)
Dr Croitoru recommends that you schedule a follow-up appointment in 12 months. You will receive a reminder letter in the mail two months in advance. If you don't receive a letter, please call our office to schedule the follow-up appointment.  If you need a refill on your cardiac medications before your next appointment, please call your pharmacy. 

## 2016-05-12 NOTE — Progress Notes (Signed)
Patient ID: Cheyenne Gallegos, female   DOB: [birth year], 61 y.o.   MRN: [id number]    Cardiology Office Note    Date:  05/12/2016   ID:  Cheyenne Heyshyllis I Mazurek, DOB [birth year], MRN [id number]  PCP:  Hadley PenOBBINS,ROBERT A, MD  Cardiologist:  Thurmon FairMihai Terah Robey, MD   Chief Complaint  Patient presents with  . Follow-up    12 months;  . Leg Pain    cramping in legs.    History of Present Illness:  Cheyenne Heyshyllis I Aleshire is a 61 y.o. female with complaints of occasional intermittent palpitations.   The last 12 months of been very emotional. Her father died from metastatic prostate cancer in February and her brother died from complications of bullous emphysema in December. During that time she had increased frequency of palpitations but these are now subsiding. She remains physically active and denies exertional angina or dyspnea, syncope, edema, claudication or focal neurological problems.  Significant comorbid conditions include Barrett's esophagus and mild COPD. She had a normal nuclear stress test in 2011 with an ejection fraction of 62%.  Past Medical History:  Diagnosis Date  . ADD (attention deficit disorder with hyperactivity)   . Barretts esophagus   . Condyloma    vulvar  . GERD (gastroesophageal reflux disease)   . Hypercholesteremia   . Insomnia   . Nausea   . Normal spontaneous vaginal delivery   . Osteopenia   . 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 VIN III (vulvar intraepithelial neoplasia III)   . Vitamin D deficiency     Past Surgical History:  Procedure Laterality Date  . BREAST BIOPSY  [phone],83,80,86  . CARDIOVASCULAR STRESS TEST  12/17/2009   Images showed normal pattern of perfusion in all regions. EKG is negative for ischemia.  Marland Kitchen. co2 laser of labia    . FOOT SURGERY  2001  . HAND SURGERY Right Apr 28 2014  . TOTAL VAGINAL HYSTERECTOMY  1995  . TUBAL LIGATION    . VULVA SURGERY      Outpatient Medications Prior to Visit  Medication Sig Dispense  Refill  . calcium carbonate (OS-CAL) 600 MG TABS Take 600 mg by mouth 2 (two) times daily with a meal.    . Cetirizine HCl (ZYRTEC PO) Take 1 tablet by mouth daily.     Marland Kitchen. estradiol (MINIVELLE) 0.1 MG/24HR patch Place 1 patch (0.1 mg total) onto the skin 2 (two) times a week. 8 patch 11  . ipratropium (ATROVENT) 0.03 % nasal spray INSTILL 1-2 SPRAYS IN EACH NOSTRIL EVERY 8 HOURS AS NEEDED 30 mL 11  . lansoprazole (PREVACID) 15 MG capsule Take 15 mg by mouth daily at 12 noon.    . Multiple Vitamins-Minerals (PRESERVISION AREDS PO) Take 1 tablet by mouth 2 (two) times daily.    . Probiotic Product (PROBIOTIC DAILY PO) Take 1 tablet by mouth daily.    . vitamin C (ASCORBIC ACID) 500 MG tablet Take 500 mg by mouth daily.    . Vitamin D, Ergocalciferol, (DRISDOL) 50000 units CAPS capsule 50,000 units every  Other week 2 capsule 11  . simvastatin (ZOCOR) 40 MG tablet TAKE ONE TABLET BY MOUTH EVERY EVENING 90 tablet 1  . Albuterol Sulfate (PROAIR RESPICLICK) 108 (90 Base) MCG/ACT AEPB Inhale 2 puffs into the lungs every 6 (six) hours as needed. (Patient not taking: Reported on 12/20/2015) 1 each 12   No facility-administered medications prior to visit.      Allergies:  Morphine   Social History   Social History  . Marital status: Married    Spouse name: N/A  . Number of children: N/A  . Years of education: N/A   Occupational History  . superivsor at [employer]    Social History Main Topics  . Smoking status: Former Smoker    Packs/day: 1.50    Years: 28.00    Types: Cigarettes    Quit date: 02/05/2000  . Smokeless tobacco: Never Used  . Alcohol use 0.0 oz/week     Comment: rare  . Drug use: Yes  . Sexual activity: No   Other Topics Concern  . None   Social History Narrative  . None     Family History:  The patient's family history includes Arrhythmia in her mother; COPD in her brother; Cancer in her paternal grandmother; Chronic Renal Failure in her mother; Colon cancer in her  paternal grandmother; Diabetes in her mother; Heart attack in her maternal grandfather; Heart disease in her mother; Heart failure in her maternal grandmother; Hypertension in her mother; Lung disease in her brother.   ROS:   Please see the history of present illness.    ROS All other systems reviewed and are negative.   PHYSICAL EXAM:   VS:  BP 122/80   Pulse (!) 57   Ht 5\' 6"  (1.676 m)   Wt 64.9 kg (143 lb)   BMI 23.08 kg/m    GEN: Well nourished, well developed, in no acute distress  HEENT: normal  Neck: no JVD, carotid bruits, or masses Cardiac: RRR; no murmurs, rubs, or gallops,no edema  Respiratory:  clear to auscultation bilaterally, normal work of breathing GI: soft, nontender, nondistended, + BS MS: no deformity or atrophy  Skin: warm and dry, no rash Neuro:  Alert and Oriented x 3, Strength and sensation are intact Psych: euthymic mood, full affect  Wt Readings from Last 3 Encounters:  05/12/16 64.9 kg (143 lb)  12/20/15 61.2 kg (135 lb)  08/12/15 65.6 kg (144 lb 9.6 oz)      Studies/Labs Reviewed:   EKG:  EKG is ordered today.  The ekg ordered today demonstrates mild sinus bradycardia, otherwise normal  Recent Labs: 12/20/2015: ALT 23; BUN 13; Creat 0.86; Hemoglobin 12.4; Platelets 298; Potassium 4.3; Sodium 140; TSH 1.17   Lipid Panel    Component Value Date/Time   CHOL 122 (L) 12/20/2015 0941   TRIG 111 12/20/2015 0941   HDL 40 (L) 12/20/2015 0941   CHOLHDL 3.1 12/20/2015 0941   VLDL 22 12/20/2015 0941   LDLCALC 60 12/20/2015 0941    ASSESSMENT:    1. Palpitations   2. Hypercholesterolemia   3. Essential hypertension      PLAN:  In order of problems listed above:  1. PACs and PVCs: As before these have flares during periods of emotional stress They are not associated with any other troublesome complaints or evidence of structural cardiac abnormalities. The palpitations never occurr during exercise. She would prefer not to take medications for  them, but I did offer a low dose of as needed beta blocker if she desires. 2. HLP: on statin, labs checked recently by Dr. Lily Peer, reportedly excellent lipid profile on current regimen. 3. HTN: Normal blood pressure without medications    Medication Adjustments/Labs and Tests Ordered: Current medicines are reviewed at length with the patient today.  Concerns regarding medicines are outlined above.  Medication changes, Labs and Tests ordered today are listed in the Patient Instructions below. Patient Instructions  Dr Royann Shivers recommends that you schedule a follow-up appointment in 12 months. You will receive a reminder letter in the mail two months in advance. If you don't receive a letter, please call our office to schedule the follow-up appointment.  If you need a refill on your cardiac medications before your next appointment, please call your pharmacy.      Signed, Thurmon Fair, MD  05/12/2016 6:27 PM    [facility] [address] [place], [place], [place]  [address] Phone: [phone]; Fax: [phone]

## 2016-05-16 ENCOUNTER — Other Ambulatory Visit: Payer: Self-pay | Admitting: Internal Medicine

## 2016-07-26 ENCOUNTER — Other Ambulatory Visit: Payer: Self-pay | Admitting: Internal Medicine

## 2016-08-09 ENCOUNTER — Encounter: Payer: Self-pay | Admitting: Gynecology

## 2016-08-11 ENCOUNTER — Encounter: Payer: Self-pay | Admitting: Internal Medicine

## 2016-08-11 ENCOUNTER — Ambulatory Visit (INDEPENDENT_AMBULATORY_CARE_PROVIDER_SITE_OTHER): Payer: Managed Care, Other (non HMO) | Admitting: Internal Medicine

## 2016-08-11 VITALS — BP 116/72 | HR 71 | Ht 66.0 in | Wt 136.8 lb

## 2016-08-11 DIAGNOSIS — J449 Chronic obstructive pulmonary disease, unspecified: Secondary | ICD-10-CM

## 2016-08-11 DIAGNOSIS — K22719 Barrett's esophagus with dysplasia, unspecified: Secondary | ICD-10-CM

## 2016-08-11 DIAGNOSIS — J310 Chronic rhinitis: Secondary | ICD-10-CM

## 2016-08-11 MED ORDER — UMECLIDINIUM-VILANTEROL 62.5-25 MCG/INH IN AEPB: 1.0000 | INHALATION_SPRAY | Freq: Every day | RESPIRATORY_TRACT | 0 refills | Status: DC

## 2016-08-11 MED ORDER — IPRATROPIUM BROMIDE 0.06 % NA SOLN: 2.0000 | Freq: Four times a day (QID) | NASAL | 12 refills | Status: DC

## 2016-08-11 NOTE — Assessment & Plan Note (Signed)
She had a long smoking history, now ended. Imaging shows upper zone emphysema. PFT in 2014 showed only minimal small airway obstruction and slight reduction of diffusion. She notices dyspnea on exertion climbing hills. I reassured her that she would not become dependent on a bronchodilator inhaler and she is going to try sample Anoro.

## 2016-08-11 NOTE — Assessment & Plan Note (Signed)
She notices occasional dry cough lying down. We discussed potential role of reflux and emphasized reflux precautions.

## 2016-08-11 NOTE — Assessment & Plan Note (Signed)
Likes Atrovent 0.03% nasal spray but it still is quite sufficient to stop chronic watery rhinorrhea. She agrees to try Atrovent 0.06%. If this doesn't take care of the problem we will need to look into other etiologies.

## 2016-08-11 NOTE — Progress Notes (Signed)
HPI female former smoker followed for COPD, rhinitis, complicated by GERD/Barrett's a1AT 02/19/13- was normal MM PFT 02/18/13-minimal obstructive airways disease, no response to bronchodilator, FVC 4.13/114%, FEV1 2.32/82%, ratio 0.56, FEF 25-75% 1.05/41%, DLCO 70%, TLC 118%  -----------------------------------------------------------------------------------------  08/12/2015-61 year old female former smoker followed for COPD, rhinitis, complicated by GERD/Barrett's FOLLOWS ZOX:WRUEAVWFOR:brother has severe lung disease. Positive forAlpha Antrip. Concerned and wants to discuss this a1AT 02/28/12- MM/ 148 WNL  08/11/16-61 year old female former smoker followed for COPD, rhinitis, complicated by GERD/Barrett's FOLLOWS FOR: pt doing well, states she is at baseline.  Remains off of cigarettes. She recognizes stable dyspnea on exertion-hills and stairs. She had not tried previous inhaler expressing concern that she might become dependent on it-discussed. Her brother Jodene NamRicky Ingold have been followed here for chronic respiratory failure with congestive heart failure and history of pulmonary emboli and bullous emphysema. She thought he had an alpha-1 antitrypsin abnormality but I was unable to verify that brief lab review. He has now expired. CXR 12/20/15 FINDINGS: Marked lucency in the upper lungs compatible with emphysema. Again noted is scarring at the lung apices. The lungs are clear without pulmonary edema or focal airspace disease. Heart and mediastinum are within normal limits. The trachea is midline. No large pleural effusions. No acute bone abnormality. IMPRESSION: No acute chest abnormality.  ROS-see HPI Constitutional:   No-   weight loss, night sweats, fevers, chills, fatigue, lassitude. HEENT:   No-  headaches, difficulty swallowing, tooth/dental problems, sore throat,       No-  sneezing, itching, ear ache, + nasal congestion,+ post nasal drip,  CV:  No-   chest pain, orthopnea, PND, swelling in  lower extremities, anasarca, dizziness, palpitations Resp: +Per HPI- shortness of breath with exertion or at rest.              No-   productive cough,  + non-productive cough,  No- coughing up of blood.              No-   change in color of mucus.  + wheezing.   Skin: No-   rash or lesions. GI:  + heartburn, indigestion, No-abdominal pain, nausea, vomiting,  GU:  MS:  No-   joint pain or swelling.   Neuro-     nothing unusual Psych:  No- change in mood or affect. No depression or anxiety.  No memory loss.  OBJ General- Alert, Oriented, Affect-appropriate, Distress- none acute; well-appearing Skin- rash-none, lesions- none, excoriation- none Lymphadenopathy- none Head- atraumatic            Eyes- Gross vision intact, PERRLA, conjunctivae clear secretions            Ears- Hearing, canals-normal            Nose-clear, no-Septal dev,  polyps,                                                             erosion, perforation             Throat- Mallampati II , mucosa clear , drainage- none, tonsils- atrophic Neck- flexible , trachea midline, no stridor , thyroid nl, carotid no bruit Chest - symmetrical excursion , unlabored           Heart/CV- RRR , no murmur , no gallop  , no  rub, nl s1 s2                           - JVD- none , edema- none, stasis changes- none, varices- none           Lung- clear to P&A/ distant, wheeze- none, cough- none , dullness-none, rub- none           Chest wall-  Abd-  Br/ Gen/ Rectal- Not done, not indicated Extrem- cyanosis- none, clubbing, none, atrophy- none, strength- nl Neuro- grossly intact to observation

## 2016-08-11 NOTE — Patient Instructions (Signed)
Sample Anoro Ellipta inhaler     Inhale 1 puff, once daily      See if you find it improves your breathing  Please call as needed

## 2016-11-13 ENCOUNTER — Other Ambulatory Visit: Payer: Self-pay | Admitting: Gynecology

## 2016-11-13 DIAGNOSIS — Z1231 Encounter for screening mammogram for malignant neoplasm of breast: Secondary | ICD-10-CM

## 2016-12-06 ENCOUNTER — Other Ambulatory Visit: Payer: Self-pay | Admitting: *Deleted

## 2016-12-06 MED ORDER — VITAMIN D (ERGOCALCIFEROL) 1.25 MG (50000 UNIT) PO CAPS: ORAL_CAPSULE | ORAL | 0 refills | Status: DC

## 2016-12-22 ENCOUNTER — Encounter: Payer: Self-pay | Admitting: Gynecology

## 2016-12-22 ENCOUNTER — Ambulatory Visit (INDEPENDENT_AMBULATORY_CARE_PROVIDER_SITE_OTHER): Payer: Managed Care, Other (non HMO) | Admitting: Gynecology

## 2016-12-22 ENCOUNTER — Ambulatory Visit
Admission: RE | Admit: 2016-12-22 | Discharge: 2016-12-22 | Disposition: A | Discharge status: Home / Self Care | Payer: Managed Care, Other (non HMO) | Source: Ambulatory Visit | Attending: Gynecology | Admitting: Gynecology

## 2016-12-22 ENCOUNTER — Encounter: Payer: Managed Care, Other (non HMO) | Admitting: Gynecology

## 2016-12-22 VITALS — BP 122/74 | Ht 66.0 in | Wt 130.0 lb

## 2016-12-22 DIAGNOSIS — Z13220 Encounter for screening for lipoid disorders: Secondary | ICD-10-CM

## 2016-12-22 DIAGNOSIS — Z79890 Hormone replacement therapy (postmenopausal): Secondary | ICD-10-CM | POA: Diagnosis not present

## 2016-12-22 DIAGNOSIS — Z01411 Encounter for gynecological examination (general) (routine) with abnormal findings: Secondary | ICD-10-CM | POA: Diagnosis not present

## 2016-12-22 DIAGNOSIS — N952 Postmenopausal atrophic vaginitis: Secondary | ICD-10-CM

## 2016-12-22 DIAGNOSIS — Z1231 Encounter for screening mammogram for malignant neoplasm of breast: Secondary | ICD-10-CM

## 2016-12-22 DIAGNOSIS — M8580 Other specified disorders of bone density and structure, unspecified site: Secondary | ICD-10-CM | POA: Diagnosis not present

## 2016-12-22 DIAGNOSIS — E559 Vitamin D deficiency, unspecified: Secondary | ICD-10-CM | POA: Diagnosis not present

## 2016-12-22 DIAGNOSIS — Z1329 Encounter for screening for other suspected endocrine disorder: Secondary | ICD-10-CM

## 2016-12-22 MED ORDER — ESTRADIOL 0.1 MG/24HR TD PTTW: 1.0000 | MEDICATED_PATCH | TRANSDERMAL | 12 refills | Status: DC

## 2016-12-22 NOTE — Patient Instructions (Signed)
Followup in one year for annual exam, sooner as needed. 

## 2016-12-22 NOTE — Progress Notes (Signed)
    Cheyenne Gallegos 11-12-1955 [id number]        61 y.o.  G1P1001 former patient of Dr. Lily Peer for annual gynecologic exam.   Past medical history,surgical history, problem list, medications, allergies, family history and social history were all reviewed and documented as reviewed in the EPIC chart.  ROS:  Performed with pertinent positives and negatives included in the history, assessment and plan.   Additional significant findings :  None   Exam: Cheyenne Gallegos assistant Vitals:   12/22/16 1118  BP: 122/74  Weight: 130 lb (59 kg)  Height:  (1.676 m)   Body mass index is 20.98 kg/m.  General appearance:  Normal affect, orientation and appearance. Skin: Grossly normal HEENT: Without gross lesions.  No cervical or supraclavicular adenopathy. Thyroid normal.  Lungs:  Clear without wheezing, rales or rhonchi Cardiac: RR, without RMG Abdominal:  Soft, nontender, without masses, guarding, rebound, organomegaly or hernia Breasts:  Examined lying and sitting without masses, retractions, discharge or axillary adenopathy. Pelvic:  Ext, BUS, Vagina: With atrophic changes.  Adnexa: Without masses or tenderness    Anus and perineum: Normal   Rectovaginal: Normal sphincter tone without palpated masses or tenderness.    Assessment/Plan:  61 y.o. G70P1001 female for annual gynecologic exam.   1. HRT. Patient is on Vivelle 0.1 mg patch. Doing well with this and wants to continue. Status post TVH 1995 DUB and dysplasia. We reviewed the whole issue of HRT to include the various studies such as the [facility] as well as NAMS 2017 guidelines. Benefits as far as symptom relief cardiovascular/bone health when started early versus risks to include thrombosis such as stroke heart attack DVT and possible breast cancer issues. At this point the patient wants to continue understanding and accepting the risks. Refill 1 year provided. 2. Osteopenia. DEXA 12/2015 T score -1.7 FRAX 15%/1.3% stable from  prior DEXA. History of vitamin D deficiency and 50,000 units every other week to maintain her vitamin D level. Check vitamin D level today. Refill her vitamin D after seeing what her level comes back. 3. 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 Exam now NED. Patient will continue with self vulvar exams and report any changes. Continue with annual provider exams. 4. Mammography today. Breast exam normal today. 5. Pap smear 2017. No Pap smear done today. History of dysplasia in the past ultimately leading to her hysterectomy. We'll continue with vaginal cuff surveillance every 3 years. 6. Colonoscopy 2013. Repeat at their recommended interval. 7. Health maintenance. Patient requests baseline labs. CBC, CMP, lipid profile, TSH, vitamin D, urinalysis ordered. Follow up in one year, sooner as needed.   Dara Lords MD, 11:47 AM 12/22/2016

## 2016-12-23 LAB — NO CULTURE INDICATED

## 2016-12-23 LAB — CBC WITH DIFFERENTIAL/PLATELET
Basophils Absolute: 18 cells/uL (ref 0–200)
Basophils Relative: 0.4 %
EOS PCT: 1.8 %
Eosinophils Absolute: 81 cells/uL (ref 15–500)
HCT: 37.1 % (ref 35.0–45.0)
Hemoglobin: 12.4 g/dL (ref 11.7–15.5)
Lymphs Abs: 1220 cells/uL (ref 850–3900)
MCH: 31 pg (ref 27.0–33.0)
MCHC: 33.4 g/dL (ref 32.0–36.0)
MCV: 92.8 fL (ref 80.0–100.0)
MPV: 9.7 fL (ref 7.5–12.5)
Monocytes Relative: 8.9 %
NEUTROS PCT: 61.8 %
Neutro Abs: 2781 cells/uL (ref 1500–7800)
PLATELETS: 273 10*3/uL (ref 140–400)
RBC: 4 10*6/uL (ref 3.80–5.10)
RDW: 11.9 % (ref 11.0–15.0)
TOTAL LYMPHOCYTE: 27.1 %
WBC mixed population: 401 cells/uL (ref 200–950)
WBC: 4.5 10*3/uL (ref 3.8–10.8)

## 2016-12-23 LAB — URINALYSIS W MICROSCOPIC + REFLEX CULTURE
BILIRUBIN URINE: NEGATIVE
GLUCOSE, UA: NEGATIVE
Hgb urine dipstick: NEGATIVE
Hyaline Cast: NONE SEEN /LPF
Ketones, ur: NEGATIVE
Leukocyte Esterase: NEGATIVE
NITRITES URINE, INITIAL: NEGATIVE
PROTEIN: NEGATIVE
Specific Gravity, Urine: 1.02 (ref 1.001–1.03)
Squamous Epithelial / LPF: >=28 /HPF — AB (ref <=5)
WBC UA: NONE SEEN /HPF (ref 0–5)
pH: 6.5 (ref 5.0–8.0)

## 2016-12-23 LAB — COMPREHENSIVE METABOLIC PANEL
AG Ratio: 1.9 (calc) (ref 1.0–2.5)
ALBUMIN MSPROF: 4 g/dL (ref 3.6–5.1)
ALKALINE PHOSPHATASE (APISO): 43 U/L (ref 33–130)
ALT: 19 U/L (ref 6–29)
AST: 20 U/L (ref 10–35)
BUN: 17 mg/dL (ref 7–25)
CO2: 28 mmol/L (ref 20–32)
CREATININE: 0.87 mg/dL (ref 0.50–0.99)
Calcium: 9 mg/dL (ref 8.6–10.4)
Chloride: 106 mmol/L (ref 98–110)
Globulin: 2.1 g/dL (calc) (ref 1.9–3.7)
Glucose, Bld: 88 mg/dL (ref 65–99)
POTASSIUM: 4 mmol/L (ref 3.5–5.3)
Sodium: 140 mmol/L (ref 135–146)
Total Bilirubin: 0.5 mg/dL (ref 0.2–1.2)
Total Protein: 6.1 g/dL (ref 6.1–8.1)

## 2016-12-23 LAB — LIPID PANEL
CHOL/HDL RATIO: 2.4 (calc) (ref <5.0)
CHOLESTEROL: 141 mg/dL (ref <200)
HDL: 58 mg/dL (ref >50)
LDL CHOLESTEROL (CALC): 69 mg/dL
Non-HDL Cholesterol (Calc): 83 mg/dL (calc) (ref <130)
Triglycerides: 61 mg/dL (ref <150)

## 2016-12-23 LAB — TSH: TSH: 1.23 mIU/L (ref 0.40–4.50)

## 2016-12-23 LAB — VITAMIN D 25 HYDROXY (VIT D DEFICIENCY, FRACTURES): Vit D, 25-Hydroxy: 45 ng/mL (ref 30–100)

## 2016-12-25 ENCOUNTER — Other Ambulatory Visit: Payer: Self-pay | Admitting: Gynecology

## 2016-12-25 DIAGNOSIS — R928 Other abnormal and inconclusive findings on diagnostic imaging of breast: Secondary | ICD-10-CM

## 2016-12-29 ENCOUNTER — Ambulatory Visit: Payer: Managed Care, Other (non HMO)

## 2016-12-29 ENCOUNTER — Ambulatory Visit
Admission: RE | Admit: 2016-12-29 | Discharge: 2016-12-29 | Disposition: A | Discharge status: Home / Self Care | Payer: Managed Care, Other (non HMO) | Source: Ambulatory Visit | Attending: Gynecology | Admitting: Gynecology

## 2016-12-29 DIAGNOSIS — R928 Other abnormal and inconclusive findings on diagnostic imaging of breast: Secondary | ICD-10-CM

## 2017-01-05 HISTORY — PX: COLONOSCOPY: SHX174

## 2017-01-09 ENCOUNTER — Other Ambulatory Visit: Payer: Self-pay | Admitting: Gynecology

## 2017-01-09 DIAGNOSIS — E559 Vitamin D deficiency, unspecified: Secondary | ICD-10-CM

## 2017-01-09 NOTE — Telephone Encounter (Signed)
Per note on 12/22/16   "History of vitamin D deficiency and 50,000 units every other week to maintain her vitamin D level. Check vitamin D level today. Refill her vitamin D after seeing what her level comes back."

## 2017-01-10 NOTE — Telephone Encounter (Signed)
Recommend 2000 units OTC daily with recheck of Vit D in 6 months

## 2017-02-16 DIAGNOSIS — B001 Herpesviral vesicular dermatitis: Secondary | ICD-10-CM | POA: Insufficient documentation

## 2017-02-16 DIAGNOSIS — F988 Other specified behavioral and emotional disorders with onset usually occurring in childhood and adolescence: Secondary | ICD-10-CM | POA: Insufficient documentation

## 2017-07-16 ENCOUNTER — Other Ambulatory Visit: Payer: Managed Care, Other (non HMO)

## 2017-07-16 ENCOUNTER — Other Ambulatory Visit: Payer: Self-pay | Admitting: Gynecology

## 2017-07-16 DIAGNOSIS — E559 Vitamin D deficiency, unspecified: Secondary | ICD-10-CM

## 2017-07-17 ENCOUNTER — Encounter: Payer: Self-pay | Admitting: Gynecology

## 2017-07-17 LAB — VITAMIN D 25 HYDROXY (VIT D DEFICIENCY, FRACTURES): Vit D, 25-Hydroxy: 44 ng/mL (ref 30–100)

## 2017-08-13 ENCOUNTER — Ambulatory Visit: Payer: Managed Care, Other (non HMO) | Admitting: Internal Medicine

## 2017-08-13 ENCOUNTER — Encounter: Payer: Self-pay | Admitting: Internal Medicine

## 2017-08-13 VITALS — BP 110/70 | HR 82 | Ht 66.0 in | Wt 126.0 lb

## 2017-08-13 DIAGNOSIS — J449 Chronic obstructive pulmonary disease, unspecified: Secondary | ICD-10-CM | POA: Diagnosis not present

## 2017-08-13 DIAGNOSIS — Z23 Encounter for immunization: Secondary | ICD-10-CM

## 2017-08-13 MED ORDER — ALBUTEROL SULFATE HFA 108 (90 BASE) MCG/ACT IN AERS: 2.0000 | INHALATION_SPRAY | Freq: Four times a day (QID) | RESPIRATORY_TRACT | 12 refills | Status: DC | PRN

## 2017-08-13 MED ORDER — IPRATROPIUM BROMIDE 0.06 % NA SOLN: 2.0000 | Freq: Four times a day (QID) | NASAL | 12 refills | Status: DC

## 2017-08-13 NOTE — Progress Notes (Signed)
HPI female former smoker followed for COPD, rhinitis, complicated by GERD/Barrett's a1AT 02/19/13- was normal MM PFT 02/18/13-minimal obstructive airways disease, no response to bronchodilator, FVC 4.13/114%, FEV1 2.32/82%, ratio 0.56, FEF 25-75% 1.05/41%, DLCO 70%, TLC 118% Office Spirometry 08/13/2017-mild obstruction.  FVC 3.67/105%, FEV1 2.12/79%, ratio 0.58, FEF 25-75% 0.98/41% -----------------------------------------------------------------------------------------  08/11/16-62 year old female former smoker followed for COPD, rhinitis, complicated by GERD/Barrett's FOLLOWS FOR: pt doing well, states she is at baseline.  Remains off of cigarettes. She recognizes stable dyspnea on exertion-hills and stairs. She had not tried previous inhaler expressing concern that she might become dependent on it-discussed. Her brother [name] have been followed here for chronic respiratory failure with congestive heart failure and history of pulmonary emboli and bullous emphysema. She thought he had an alpha-1 antitrypsin abnormality but I was unable to verify that brief lab review. He has now expired. CXR 12/20/15 FINDINGS: Marked lucency in the upper lungs compatible with emphysema. Again noted is scarring at the lung apices. The lungs are clear without pulmonary edema or focal airspace disease. Heart and mediastinum are within normal limits. The trachea is midline. No large pleural effusions. No acute bone abnormality. IMPRESSION: No acute chest abnormality.  08/13/2017- 62 year old female former smoker followed for COPD, rhinitis, complicated by GERD/Barrett's ----COPD mixed type: Pt states she is doing well-has not had to use Anoro inhaler and would like to get rescue inhaler to have on hand.  Usually no cough or wheeze. 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  Has to wear a mask with any exposure to pepper spray or similar irritants. Had flu last winter despite flu vaccine.   She want to maximize her protection against pneumonia.  In 2010 she had local reaction to Pneumovax.  I told her Prevnar would be more likely to do the same thing because of that experience.  After consideration, she decided to go ahead. Office Spirometry 08/13/2017-mild obstruction.  FVC 3.67/105%, FEV1 2.12/79%, ratio 0.58, FEF 25-75% 0.98/41%  ROS-see HPI + = positive Constitutional:   No-   weight loss, night sweats, fevers, chills, fatigue, lassitude. HEENT:   No-  headaches, difficulty swallowing, tooth/dental problems, sore throat,       No-  sneezing, itching, ear ache,  nasal congestion, post nasal drip,  CV:  No-   chest pain, orthopnea, PND, swelling in lower extremities, anasarca, dizziness, palpitations Resp: +Per HPI- shortness of breath with exertion or at rest.              No-   productive cough,  + non-productive cough,  No- coughing up of blood.              No-   change in color of mucus.   wheezing.   Skin: No-   rash or lesions. GI:  + heartburn, indigestion, No-abdominal pain, nausea, vomiting,  GU:  MS:  No-   joint pain or swelling.   Neuro-     nothing unusual Psych:  No- change in mood or affect. No depression or anxiety.  No memory loss.  OBJ General- Alert, Oriented, Affect-appropriate, Distress- none acute; well-appearing Skin- rash-none, lesions- none, excoriation- none Lymphadenopathy- none Head- atraumatic            Eyes- Gross vision intact, PERRLA, conjunctivae clear secretions            Ears- Hearing, canals-normal            Nose-clear, no-Septal dev,  polyps,  erosion, perforation             Throat- Mallampati II , mucosa clear , drainage- none, tonsils- atrophic Neck- flexible , trachea midline, no stridor , thyroid nl, carotid no bruit Chest - symmetrical excursion , unlabored           Heart/CV- RRR , no murmur , no gallop  , no rub, nl s1 s2                           - JVD- none ,  edema- none, stasis changes- none, varices- none           Lung- clear to P&A/ distant, wheeze- none, cough- none , dullness-none, rub- none           Chest wall-  Abd-  Br/ Gen/ Rectal- Not done, not indicated Extrem- cyanosis- none, clubbing, none, atrophy- none, strength- nl Neuro- grossly intact to observation

## 2017-08-13 NOTE — Patient Instructions (Addendum)
Prevnar 13 pneumonia vaccine   Order- office spirometry  Dx COPD mixed type  Script sent for albuterol rescue inhaler to [facility]  Refill Atrovent nasal spray sent to [facility]  Please call if we can help

## 2017-08-13 NOTE — Assessment & Plan Note (Signed)
Mild obstructive airways disease with very little reactive component.  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

## 2017-09-04 DIAGNOSIS — Z79899 Other long term (current) drug therapy: Secondary | ICD-10-CM | POA: Insufficient documentation

## 2017-09-05 DIAGNOSIS — G2581 Restless legs syndrome: Secondary | ICD-10-CM | POA: Insufficient documentation

## 2017-09-05 DIAGNOSIS — I7300 Raynaud's syndrome without gangrene: Secondary | ICD-10-CM | POA: Insufficient documentation

## 2017-12-04 ENCOUNTER — Other Ambulatory Visit: Payer: Self-pay | Admitting: Gynecology

## 2017-12-04 ENCOUNTER — Other Ambulatory Visit: Payer: Self-pay | Admitting: Obstetrics & Gynecology

## 2017-12-04 DIAGNOSIS — Z1231 Encounter for screening mammogram for malignant neoplasm of breast: Secondary | ICD-10-CM

## 2017-12-25 ENCOUNTER — Other Ambulatory Visit: Payer: Self-pay | Admitting: Gynecology

## 2017-12-25 NOTE — Telephone Encounter (Signed)
annual on 02/12/18

## 2017-12-27 ENCOUNTER — Ambulatory Visit
Admission: RE | Admit: 2017-12-27 | Discharge: 2017-12-27 | Disposition: A | Discharge status: Home / Self Care | Payer: Managed Care, Other (non HMO) | Source: Ambulatory Visit | Attending: Obstetrics & Gynecology | Admitting: Obstetrics & Gynecology

## 2017-12-27 DIAGNOSIS — Z1231 Encounter for screening mammogram for malignant neoplasm of breast: Secondary | ICD-10-CM

## 2018-01-02 ENCOUNTER — Other Ambulatory Visit: Payer: Self-pay | Admitting: Gastroenterology

## 2018-01-07 ENCOUNTER — Ambulatory Visit: Payer: Managed Care, Other (non HMO) | Admitting: Gastroenterology

## 2018-01-07 ENCOUNTER — Encounter: Payer: Self-pay | Admitting: Gastroenterology

## 2018-01-07 VITALS — BP 120/74 | HR 66 | Ht 66.0 in | Wt 132.1 lb

## 2018-01-07 DIAGNOSIS — K2270 Barrett's esophagus without dysplasia: Secondary | ICD-10-CM

## 2018-01-07 MED ORDER — LANSOPRAZOLE 30 MG PO CPDR: 30.0000 mg | DELAYED_RELEASE_CAPSULE | Freq: Every day | ORAL | 11 refills | Status: DC

## 2018-01-07 MED ORDER — LANSOPRAZOLE 15 MG PO CPDR: 15.0000 mg | DELAYED_RELEASE_CAPSULE | Freq: Every day | ORAL | 11 refills | Status: DC

## 2018-01-07 NOTE — Progress Notes (Signed)
Chief Complaint:   Referring Provider:  Hadley Pen, MD      ASSESSMENT AND PLAN;   #1. GERD with short segment Barrett's esophagus (EGD 07/2015 no dysplasia).  Plan: - Pravacid 30mg  po qd to continue for now. - EGD 07/2018 for surveillance. -Patient is to call us if she starts having any new problems. HPI:    Cheyenne Gallegos is a 62 y.o. female  For follow-up visit For medication refill No nausea, vomiting, heartburn, regurgitation, odynophagia or dysphagia.  No significant diarrhea or constipation.  There is no melena or hematochezia. No unintentional weight loss.  -Colonoscopy 12/2016: Except for small internal hemorrhoids.  Repeat in 10 years.  Earlier, if with any new problems. Past Medical History:  Diagnosis Date  . ADD (attention deficit disorder with hyperactivity)   . Barretts esophagus   . Condyloma    vulvar  . GERD (gastroesophageal reflux disease)   . Hypercholesteremia   . Insomnia   . Nausea   . Normal spontaneous vaginal delivery   . Osteopenia   . 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 VIN III (vulvar intraepithelial neoplasia III)   . Vitamin D deficiency     Past Surgical History:  Procedure Laterality Date  . BREAST BIOPSY  [phone]  . BREAST EXCISIONAL BIOPSY Right 1979  . BREAST EXCISIONAL BIOPSY Right 1980  . BREAST EXCISIONAL BIOPSY Right 1983  . BREAST EXCISIONAL BIOPSY Right 1986  . CARDIOVASCULAR STRESS TEST  12/17/2009   Images showed normal pattern of perfusion in all regions. EKG is negative for ischemia.  Marland Kitchen co2 laser of labia    . COLONOSCOPY  01/05/2017   Small internal hemorrhoids. Otherwise normal colonoscopy.   . ESOPHAGOGASTRODUODENOSCOPY  08/20/2015   Barretts esophagus. Gastric poylp.  Marland Kitchen FOOT SURGERY  2001  . HAND SURGERY Right Apr 28 2014  . TOTAL VAGINAL HYSTERECTOMY  1995  . TUBAL LIGATION    . VULVA SURGERY      Family History  Problem Relation Age of Onset  . Hypertension  Mother   . Diabetes Mother   . Heart disease Mother   . Chronic Renal Failure Mother   . Arrhythmia Mother        Atrial Fibrillation  . Colon cancer Paternal Grandmother   . Cancer Paternal Grandmother        Colon cancer  . Heart failure Maternal Grandmother   . Heart attack Maternal Grandfather   . COPD Brother   . Lung disease Brother   . Breast cancer Neg Hx     Social History   Tobacco Use  . Smoking status: Former Smoker    Packs/day: 1.50    Years: 28.00    Pack years: 42.00    Types: Cigarettes    Last attempt to quit: 02/05/2000    Years since quitting: 17.9  . Smokeless tobacco: Never Used  Substance Use Topics  . Alcohol use: Yes    Alcohol/week: 1.0 standard drinks    Types: 1 Standard drinks or equivalent per week    Comment: rare  . Drug use: No    Current Outpatient Medications  Medication Sig Dispense Refill  . albuterol (PROVENTIL HFA;VENTOLIN HFA) 108 (90 Base) MCG/ACT inhaler Inhale 2 puffs into the lungs every 6 (six) hours as needed for wheezing or shortness of breath. 18 g 12  . calcium carbonate (OS-CAL) 600 MG TABS Take 600 mg by mouth 2 (two) times daily with  a meal.    . Cetirizine HCl (ZYRTEC PO) Take 1 tablet by mouth daily.     Marland Kitchen estradiol (VIVELLE-DOT) 0.1 MG/24HR patch APPLY 1 PATCH TWICE WEEKLY 8 patch 1  . ipratropium (ATROVENT) 0.06 % nasal spray Place 2 sprays into both nostrils 4 (four) times daily. 15 mL 12  . lansoprazole (PREVACID) 15 MG capsule Take 15 mg by mouth daily at 12 noon.    . lisdexamfetamine (VYVANSE) 70 MG capsule Take 70 mg by mouth daily.    . Multiple Vitamins-Minerals (PRESERVISION AREDS PO) Take 1 tablet by mouth 2 (two) times daily.    . Probiotic Product (PROBIOTIC DAILY PO) Take 1 tablet by mouth daily.    . vitamin C (ASCORBIC ACID) 500 MG tablet Take 500 mg by mouth daily.     No current facility-administered medications for this visit.     Allergies  Allergen Reactions  . Morphine Nausea And Vomiting     Review of Systems:  Negative except for HPI     Physical Exam:    BP 120/74   Pulse 66   Ht 5\' 6"  (1.676 m)   Wt 132 lb 2 oz (59.9 kg)   BMI 21.33 kg/m  Filed Weights   01/07/18 1619  Weight: 132 lb 2 oz (59.9 kg)   Constitutional:  Well-developed, in no acute distress. Psychiatric: Normal mood and affect. Behavior is normal. HEENT: Pupils normal.  Conjunctivae are normal. No scleral icterus.  Pulmonary/chest: Effort normal and breath sounds normal. No wheezing, rales or rhonchi. Abdominal: Soft, nondistended. Nontender. Bowel sounds active throughout. There are no masses palpable. No hepatomegaly.  Data Reviewed: I have personally reviewed following labs and imaging studies  CBC: CBC Latest Ref Rng & Units 12/22/2016 12/20/2015 11/02/2014  WBC 3.8 - 10.8 Thousand/uL 4.5 5.7 4.1  Hemoglobin 11.7 - 15.5 g/dL 16.1 09.6 04.5  Hematocrit 35.0 - 45.0 % 37.1 38.0 38.7  Platelets 140 - 400 Thousand/uL 273 298 257    CMP: CMP Latest Ref Rng & Units 12/22/2016 12/20/2015 11/02/2014  Glucose 65 - 99 mg/dL 88 90 85  BUN 7 - 25 mg/dL 17 13 19   Creatinine 0.50 - 0.99 mg/dL 4.09 8.11 9.14  Sodium 135 - 146 mmol/L 140 140 140  Potassium 3.5 - 5.3 mmol/L 4.0 4.3 4.0  Chloride 98 - 110 mmol/L 106 105 105  CO2 20 - 32 mmol/L 28 27 26   Calcium 8.6 - 10.4 mg/dL 9.0 9.2 9.0  Total Protein 6.1 - 8.1 g/dL 6.1 6.3 6.2  Total Bilirubin 0.2 - 1.2 mg/dL 0.5 0.3 0.5  Alkaline Phos 33 - 130 U/L - 62 48  AST 10 - 35 U/L 20 20 20   ALT 6 - 29 U/L 19 23 13      Radiology Studies: Mm 3d Screen Breast Bilateral  Result Date: 12/28/2017 CLINICAL DATA:  Screening. EXAM: DIGITAL SCREENING BILATERAL MAMMOGRAM WITH TOMO AND CAD COMPARISON:  Previous exam(s). ACR Breast Density Category c: The breast tissue is heterogeneously dense, which may obscure small masses. FINDINGS: There are no findings suspicious for malignancy. Images were processed with CAD. IMPRESSION: No mammographic evidence of malignancy. A  result letter of this screening mammogram will be mailed directly to the patient. RECOMMENDATION: Screening mammogram in one year. (Code:SM-B-01Y) BI-RADS CATEGORY  1: Negative. Electronically Signed   By: Sande Brothers M.D.   On: 12/28/2017 09:24      Edman Circle, MD 01/07/2018, 4:27 PM  Cc: Hadley Pen, MD

## 2018-01-07 NOTE — Patient Instructions (Addendum)
If you are age 62 or older, your body mass index should be between 23-30. Your Body mass index is 21.33 kg/m. If this is out of the aforementioned range listed, please consider follow up with your Primary Care Provider.  If you are age 35 or younger, your body mass index should be between 19-25. Your Body mass index is 21.33 kg/m. If this is out of the aformentioned range listed, please consider follow up with your Primary Care Provider.   You will be due for a recall EGD in 07/2018. We will send you a reminder in the mail when it gets closer to that time.  We have sent the following medications to your pharmacy for you to pick up at your convenience: Prevacid 30 mg once daily.   Thank you,  Dr. Lynann Bologna

## 2018-02-12 ENCOUNTER — Ambulatory Visit (INDEPENDENT_AMBULATORY_CARE_PROVIDER_SITE_OTHER): Payer: Managed Care, Other (non HMO) | Admitting: Obstetrics & Gynecology

## 2018-02-12 ENCOUNTER — Encounter: Payer: Self-pay | Admitting: Obstetrics & Gynecology

## 2018-02-12 VITALS — BP 110/70 | Ht 66.0 in | Wt 136.0 lb

## 2018-02-12 DIAGNOSIS — Z90710 Acquired absence of both cervix and uterus: Secondary | ICD-10-CM

## 2018-02-12 DIAGNOSIS — Z01419 Encounter for gynecological examination (general) (routine) without abnormal findings: Secondary | ICD-10-CM

## 2018-02-12 DIAGNOSIS — D071 Carcinoma in situ of vulva: Secondary | ICD-10-CM

## 2018-02-12 DIAGNOSIS — Z9889 Other specified postprocedural states: Secondary | ICD-10-CM | POA: Diagnosis not present

## 2018-02-12 DIAGNOSIS — M8589 Other specified disorders of bone density and structure, multiple sites: Secondary | ICD-10-CM

## 2018-02-12 DIAGNOSIS — Z79890 Hormone replacement therapy (postmenopausal): Secondary | ICD-10-CM

## 2018-02-12 MED ORDER — ESTRADIOL 0.1 MG/24HR TD PTTW: 1.0000 | MEDICATED_PATCH | TRANSDERMAL | 4 refills | Status: DC

## 2018-02-12 NOTE — Addendum Note (Signed)
Addended by: Berna SpareASTILLO, Kooper Chriswell A on: 02/12/2018 10:06 AM   Modules accepted: Orders

## 2018-02-12 NOTE — Patient Instructions (Addendum)
  1. Encounter for Papanicolaou smear of vagina as part of routine gynecological examination Gynecologic exam status post total hysterectomy in menopause.  Pap reflex done on the vaginal vault.  Breast exam normal.  Screening mammogram October 2019 was negative.  Colonoscopy in 2013.  Fasting health labs here today.  Good body mass index at 21.95.  Continue with healthy nutrition and regular fitness. - CBC - Comp Met (CMET) - TSH - Lipid panel - VITAMIN D 25 Hydroxy (Vit-D Deficiency, Fractures)  2. S/P total hysterectomy  3. H/O LEEP Will continue to do Paps at Vaginal vault every 2-3 yrs  4. Severe vulvar dysplasia, histologically confirmed Normal vulva on exam today.  5. Postmenopausal hormone replacement therapy Status post total hysterectomy.  Well on estradiol patch 0.1 twice weekly.  Has been on hormone replacement therapy for 8 years (since 2011).  No contraindication to continue.  Estradiol patch 0.1 twice weekly prescription sent to pharmacy.  6. Osteopenia of multiple sites Osteopenia at multiple sites on bone density in October 2017.  Will repeat bone density now.  Continue with vitamin D supplements, calcium intake of 1.5 g/day and regular weightbearing physical activities.  Vitamin D level drawn today. - DG Bone Density; Future  Other orders - estradiol (VIVELLE-DOT) 0.1 MG/24HR patch; Place 1 patch (0.1 mg total) onto the skin 2 (two) times a week.   Cheyenne Gallegos, it was a pleasure meeting you today!  I will inform you of your results as soon as they are available.

## 2018-02-12 NOTE — Progress Notes (Signed)
  Cheyenne Gallegos 07/24/1955 [id number]   History:    62 y.o. G1P1L1 Married  RP:  Established patient presenting for annual gyn exam   HPI: S/P Total Hysterectomy. Postmenopausal well on HRT with Estradiol patch 0.1 twice weekly, on HRT x 2011.  No pelvic pain.  No pain with IC.  Urine/BMs wnl.  Breasts normal.  BMI 21.95.  Healthy nutrition and good fitness.  Fasting Health Labs here today.  Last Colono 2013.  Past medical history,surgical history, family history and social history were all reviewed and documented in the EPIC chart.  Gynecologic History No LMP recorded. Patient has had a hysterectomy. Contraception: status post hysterectomy Last Pap: 11/2015. Results were: Negative Last mammogram: 12/2017. Results were: Negative Bone Density: 12/2015 Osteopenia.  Will schedule now Colonoscopy: 2013.  10 yr schedule  Obstetric History OB History  Gravida Para Term Preterm AB Living  1 1 1     1  SAB TAB Ectopic Multiple Live Births          1    # Outcome Date GA Lbr Len/2nd Weight Sex Delivery Anes PTL Lv  1 Term     M Vag-Spont  N LIV     ROS: A ROS was performed and pertinent positives and negatives are included in the history.  GENERAL: No fevers or chills. HEENT: No change in vision, no earache, sore throat or sinus congestion. NECK: No pain or stiffness. CARDIOVASCULAR: No chest pain or pressure. No palpitations. PULMONARY: No shortness of breath, cough or wheeze. GASTROINTESTINAL: No abdominal pain, nausea, vomiting or diarrhea, melena or bright red blood per rectum. GENITOURINARY: No urinary frequency, urgency, hesitancy or dysuria. MUSCULOSKELETAL: No joint or muscle pain, no back pain, no recent trauma. DERMATOLOGIC: No rash, no itching, no lesions. ENDOCRINE: No polyuria, polydipsia, no heat or cold intolerance. No recent change in weight. HEMATOLOGICAL: No anemia or easy bruising or bleeding. NEUROLOGIC: No headache, seizures, numbness, tingling or weakness.  PSYCHIATRIC: No depression, no loss of interest in normal activity or change in sleep pattern.     Exam:   BP 110/70   Ht 5' 6" (1.676 m)   Wt 136 lb (61.7 kg)   BMI 21.95 kg/m   Body mass index is 21.95 kg/m.  General appearance : Well developed well nourished female. No acute distress HEENT: Eyes: no retinal hemorrhage or exudates,  Neck supple, trachea midline, no carotid bruits, no thyroidmegaly Lungs: Clear to auscultation, no rhonchi or wheezes, or rib retractions  Heart: Regular rate and rhythm, no murmurs or gallops Breast:Examined in sitting and supine position were symmetrical in appearance, no palpable masses or tenderness,  no skin retraction, no nipple inversion, no nipple discharge, no skin discoloration, no axillary or supraclavicular lymphadenopathy Abdomen: no palpable masses or tenderness, no rebound or guarding Extremities: no edema or skin discoloration or tenderness  Pelvic: Vulva: Normal             Vagina: No gross lesions or discharge.  Pap reflex done  Cervix/Uterus absent  Adnexa  Without masses or tenderness  Anus: Normal   Assessment/Plan:  62 y.o. female for annual exam   1. Encounter for Papanicolaou smear of vagina as part of routine gynecological examination Gynecologic exam status post total hysterectomy in menopause.  Pap reflex done on the vaginal vault.  Breast exam normal.  Screening mammogram October 2019 was negative.  Colonoscopy in 2013.  Fasting health labs here today.  Good body mass index at 21.95.  Continue with   healthy nutrition and regular fitness. - CBC - Comp Met (CMET) - TSH - Lipid panel - VITAMIN D 25 Hydroxy (Vit-D Deficiency, Fractures)  2. S/P total hysterectomy  3. H/O LEEP Will continue to do Paps at Vaginal vault every 2-3 yrs  4. Severe vulvar dysplasia, histologically confirmed Normal vulva on exam today.  5. Postmenopausal hormone replacement therapy Status post total hysterectomy.  Well on estradiol patch  0.1 twice weekly.  Has been on hormone replacement therapy for 8 years (since 2011).  No contraindication to continue.  Estradiol patch 0.1 twice weekly prescription sent to pharmacy.  6. Osteopenia of multiple sites Osteopenia at multiple sites on bone density in October 2017.  Will repeat bone density now.  Continue with vitamin D supplements, calcium intake of 1.5 g/day and regular weightbearing physical activities.  Vitamin D level drawn today. - DG Bone Density; Future  Other orders - estradiol (VIVELLE-DOT) 0.1 MG/24HR patch; Place 1 patch (0.1 mg total) onto the skin 2 (two) times a week.  Princess Bruins MD, 8:35 AM 02/12/2018

## 2018-02-13 LAB — CBC
HCT: 37.9 % (ref 35.0–45.0)
Hemoglobin: 12.9 g/dL (ref 11.7–15.5)
MCH: 31.5 pg (ref 27.0–33.0)
MCHC: 34 g/dL (ref 32.0–36.0)
MCV: 92.4 fL (ref 80.0–100.0)
MPV: 9.6 fL (ref 7.5–12.5)
PLATELETS: 280 10*3/uL (ref 140–400)
RBC: 4.1 10*6/uL (ref 3.80–5.10)
RDW: 11.9 % (ref 11.0–15.0)
WBC: 4.6 10*3/uL (ref 3.8–10.8)

## 2018-02-13 LAB — COMPREHENSIVE METABOLIC PANEL
AG RATIO: 1.9 (calc) (ref 1.0–2.5)
ALKALINE PHOSPHATASE (APISO): 47 U/L (ref 33–130)
ALT: 20 U/L (ref 6–29)
AST: 23 U/L (ref 10–35)
Albumin: 4 g/dL (ref 3.6–5.1)
BUN/Creatinine Ratio: 18 (calc) (ref 6–22)
BUN: 19 mg/dL (ref 7–25)
CO2: 27 mmol/L (ref 20–32)
CREATININE: 1.03 mg/dL — AB (ref 0.50–0.99)
Calcium: 9.2 mg/dL (ref 8.6–10.4)
Chloride: 104 mmol/L (ref 98–110)
GLOBULIN: 2.1 g/dL (ref 1.9–3.7)
Glucose, Bld: 77 mg/dL (ref 65–99)
Potassium: 4.2 mmol/L (ref 3.5–5.3)
Sodium: 139 mmol/L (ref 135–146)
Total Bilirubin: 0.4 mg/dL (ref 0.2–1.2)
Total Protein: 6.1 g/dL (ref 6.1–8.1)

## 2018-02-13 LAB — LIPID PANEL
CHOLESTEROL: 208 mg/dL — AB (ref <200)
HDL: 58 mg/dL (ref >50)
LDL Cholesterol (Calc): 131 mg/dL (calc) — ABNORMAL HIGH
Non-HDL Cholesterol (Calc): 150 mg/dL (calc) — ABNORMAL HIGH (ref <130)
Total CHOL/HDL Ratio: 3.6 (calc) (ref <5.0)
Triglycerides: 86 mg/dL (ref <150)

## 2018-02-13 LAB — TSH: TSH: 2.38 m[IU]/L (ref 0.40–4.50)

## 2018-02-13 LAB — VITAMIN D 25 HYDROXY (VIT D DEFICIENCY, FRACTURES): VIT D 25 HYDROXY: 45 ng/mL (ref 30–100)

## 2018-02-14 LAB — PAP IG W/ RFLX HPV ASCU

## 2018-02-14 LAB — HUMAN PAPILLOMAVIRUS, HIGH RISK: HPV DNA HIGH RISK: NOT DETECTED

## 2018-02-26 ENCOUNTER — Ambulatory Visit (INDEPENDENT_AMBULATORY_CARE_PROVIDER_SITE_OTHER): Payer: Managed Care, Other (non HMO)

## 2018-02-26 DIAGNOSIS — Z13820 Encounter for screening for osteoporosis: Secondary | ICD-10-CM

## 2018-02-26 DIAGNOSIS — M8589 Other specified disorders of bone density and structure, multiple sites: Secondary | ICD-10-CM

## 2018-02-27 ENCOUNTER — Other Ambulatory Visit: Payer: Self-pay | Admitting: Obstetrics & Gynecology

## 2018-02-27 DIAGNOSIS — M8589 Other specified disorders of bone density and structure, multiple sites: Secondary | ICD-10-CM

## 2018-02-27 DIAGNOSIS — Z13820 Encounter for screening for osteoporosis: Secondary | ICD-10-CM

## 2018-08-15 ENCOUNTER — Ambulatory Visit: Payer: Managed Care, Other (non HMO) | Admitting: Internal Medicine

## 2018-09-17 ENCOUNTER — Other Ambulatory Visit: Payer: Self-pay | Admitting: Internal Medicine

## 2018-10-10 ENCOUNTER — Encounter: Payer: Self-pay | Admitting: Gastroenterology

## 2018-10-14 ENCOUNTER — Ambulatory Visit (INDEPENDENT_AMBULATORY_CARE_PROVIDER_SITE_OTHER): Payer: Managed Care, Other (non HMO)

## 2018-10-14 ENCOUNTER — Encounter: Payer: Self-pay | Admitting: Internal Medicine

## 2018-10-14 ENCOUNTER — Other Ambulatory Visit: Payer: Self-pay

## 2018-10-14 ENCOUNTER — Ambulatory Visit: Payer: Managed Care, Other (non HMO) | Admitting: Internal Medicine

## 2018-10-14 VITALS — BP 116/70 | HR 61 | Temp 97.7°F | Ht 66.0 in | Wt 134.1 lb

## 2018-10-14 DIAGNOSIS — J449 Chronic obstructive pulmonary disease, unspecified: Secondary | ICD-10-CM

## 2018-10-14 DIAGNOSIS — K22719 Barrett's esophagus with dysplasia, unspecified: Secondary | ICD-10-CM | POA: Diagnosis not present

## 2018-10-14 MED ORDER — ALBUTEROL SULFATE HFA 108 (90 BASE) MCG/ACT IN AERS: 2.0000 | INHALATION_SPRAY | Freq: Four times a day (QID) | RESPIRATORY_TRACT | 12 refills | Status: DC | PRN

## 2018-10-14 MED ORDER — IPRATROPIUM BROMIDE 0.06 % NA SOLN: NASAL | 12 refills | Status: DC

## 2018-10-14 NOTE — Patient Instructions (Signed)
Order- CXR    Dx COPD mixed type  Scripts renewed for Atrovent nasal spray and albuterol rescue inhaler  Sample Anoro maintenance inhaler       Inhale 1 puff, once daily. See if it makes any difference in your breathing.   Please cal if we can help

## 2018-10-14 NOTE — Progress Notes (Signed)
HPI female former smoker followed for COPD, rhinitis, complicated by GERD/Barrett's Her brother Jodene NamRicky Ingold had been followed here for chronic respiratory failure with congestive heart failure and history of pulmonary emboli and bullous emphysema. She thought he had an alpha-1 antitrypsin abnormality  a1AT 02/19/13- was normal MM PFT 02/18/13-minimal obstructive airways disease, no response to bronchodilator, FVC 4.13/114%, FEV1 2.32/82%, ratio 0.56, FEF 25-75% 1.05/41%, DLCO 70%, TLC 118% Office Spirometry 08/13/2017-mild obstruction.  FVC 3.67/105%, FEV1 2.12/79%, ratio 0.58, FEF 25-75% 0.98/41% -----------------------------------------------------------------------------------------  08/13/2017- [age] year old female former smoker followed for COPD, rhinitis, complicated by GERD/Barrett's ----COPD mixed type: Pt states she is doing well-has not had to use Anoro inhaler and would like to get rescue inhaler to have on hand.  Usually no cough or wheeze. 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  Has to wear a mask with any exposure to pepper spray or similar irritants. Had flu last winter despite flu vaccine.  She want to maximize her protection against pneumonia.  In 2010 she had local reaction to Pneumovax.  I told her Prevnar would be more likely to do the same thing because of that experience.  After consideration, she decided to go ahead. Office Spirometry 08/13/2017-mild obstruction.  FVC 3.67/105%, FEV1 2.12/79%, ratio 0.58, FEF 25-75% 0.98/41%  10/14/2018- 63 year old female former smoker followed for COPD, rhinitis, complicated by GERD/Barrett's -----pt states breathing is at baseline, has albuterol rescue inhaler available but hasn't had to use it Occasionally feel unable to take satisfying deep breath, but no sustained or exertional concern.  Working for [employer]   ROS-see HPI + = positive Constitutional:   No-   weight loss, night sweats, fevers, chills, fatigue,  lassitude. HEENT:   No-  headaches, difficulty swallowing, tooth/dental problems, sore throat,       No-  sneezing, itching, ear ache,  nasal congestion, post nasal drip,  CV:  No-   chest pain, orthopnea, PND, swelling in lower extremities, anasarca, dizziness, palpitations Resp: +Per HPI- shortness of breath with exertion or at rest.              No-   productive cough,  + non-productive cough,  No- coughing up of blood.              No-   change in color of mucus.   wheezing.   Skin: No-   rash or lesions. GI:  + heartburn, indigestion, No-abdominal pain, nausea, vomiting,  GU:  MS:  No-   joint pain or swelling.   Neuro-     nothing unusual Psych:  No- change in mood or affect. No depression or anxiety.  No memory loss.  OBJ General- Alert, Oriented, Affect-appropriate, Distress- none acute; well-appearing Skin- rash-none, lesions- none, excoriation- none Lymphadenopathy- none Head- atraumatic            Eyes- Gross vision intact, PERRLA, conjunctivae clear secretions            Ears- Hearing, canals-normal            Nose-clear, no-Septal dev,  polyps,                                                             erosion, perforation  Throat- Mallampati II , mucosa clear , drainage- none, tonsils- atrophic Neck- flexible , trachea midline, no stridor , thyroid nl, carotid no bruit Chest - symmetrical excursion , unlabored           Heart/CV- RRR , no murmur , no gallop  , no rub, nl s1 s2                           - JVD- none , edema- none, stasis changes- none, varices- none           Lung- clear to P&A/ distant, wheeze- none, cough- none , dullness-none, rub- none           Chest wall-  Abd-  Br/ Gen/ Rectal- Not done, not indicated Extrem- cyanosis- none, clubbing, none, atrophy- none, strength- nl Neuro- grossly intact to observation

## 2018-11-12 ENCOUNTER — Encounter: Payer: Self-pay | Admitting: Gastroenterology

## 2018-11-20 ENCOUNTER — Ambulatory Visit: Payer: Managed Care, Other (non HMO) | Admitting: Internal Medicine

## 2018-12-02 NOTE — Assessment & Plan Note (Signed)
Emphasis on reflux precautions to prevent aspiration

## 2018-12-02 NOTE — Assessment & Plan Note (Addendum)
Mild and clinically stable without acute episodes. Plan- try Anoro for therapeutic effect as a maintenance controller, CXR

## 2018-12-10 ENCOUNTER — Ambulatory Visit (AMBULATORY_SURGERY_CENTER): Payer: Self-pay | Admitting: *Deleted

## 2018-12-10 ENCOUNTER — Other Ambulatory Visit: Payer: Self-pay

## 2018-12-10 VITALS — Temp 97.1°F | Ht 66.0 in | Wt 131.0 lb

## 2018-12-10 DIAGNOSIS — K2270 Barrett's esophagus without dysplasia: Secondary | ICD-10-CM

## 2018-12-10 NOTE — Progress Notes (Signed)
No egg or soy allergy known to patient  No issues with past sedation with any surgeries  or procedures, no intubation problems  No diet pills per patient No home 02 use per patient  No blood thinners per patient  Pt denies issues with constipation  No A fib or A flutter  EMMI video declined    

## 2018-12-11 ENCOUNTER — Other Ambulatory Visit: Payer: Self-pay | Admitting: Obstetrics & Gynecology

## 2018-12-11 ENCOUNTER — Other Ambulatory Visit: Payer: Self-pay

## 2018-12-11 DIAGNOSIS — Z1231 Encounter for screening mammogram for malignant neoplasm of breast: Secondary | ICD-10-CM

## 2018-12-12 ENCOUNTER — Ambulatory Visit (INDEPENDENT_AMBULATORY_CARE_PROVIDER_SITE_OTHER): Payer: Managed Care, Other (non HMO) | Admitting: Obstetrics & Gynecology

## 2018-12-12 ENCOUNTER — Encounter: Payer: Self-pay | Admitting: Gastroenterology

## 2018-12-12 ENCOUNTER — Encounter: Payer: Self-pay | Admitting: Obstetrics & Gynecology

## 2018-12-12 VITALS — BP 130/78 | Ht 66.0 in | Wt 129.0 lb

## 2018-12-12 DIAGNOSIS — Z90710 Acquired absence of both cervix and uterus: Secondary | ICD-10-CM

## 2018-12-12 DIAGNOSIS — Z79890 Hormone replacement therapy (postmenopausal): Secondary | ICD-10-CM

## 2018-12-12 DIAGNOSIS — Z01419 Encounter for gynecological examination (general) (routine) without abnormal findings: Secondary | ICD-10-CM

## 2018-12-12 DIAGNOSIS — M8589 Other specified disorders of bone density and structure, multiple sites: Secondary | ICD-10-CM

## 2018-12-12 DIAGNOSIS — Z1272 Encounter for screening for malignant neoplasm of vagina: Secondary | ICD-10-CM

## 2018-12-12 LAB — LIPID PANEL
Cholesterol: 196 mg/dL (ref <200)
HDL: 63 mg/dL (ref >=50)
LDL Cholesterol (Calc): 118 mg/dL (calc) — ABNORMAL HIGH
Non-HDL Cholesterol (Calc): 133 mg/dL (calc) — ABNORMAL HIGH (ref <130)
Total CHOL/HDL Ratio: 3.1 (calc) (ref <5.0)
Triglycerides: 62 mg/dL (ref <150)

## 2018-12-12 LAB — COMPREHENSIVE METABOLIC PANEL
AG Ratio: 2.3 (calc) (ref 1.0–2.5)
ALT: 19 U/L (ref 6–29)
AST: 21 U/L (ref 10–35)
Albumin: 4.2 g/dL (ref 3.6–5.1)
Alkaline phosphatase (APISO): 44 U/L (ref 37–153)
BUN/Creatinine Ratio: 20 (calc) (ref 6–22)
BUN: 20 mg/dL (ref 7–25)
CO2: 29 mmol/L (ref 20–32)
Calcium: 9.1 mg/dL (ref 8.6–10.4)
Chloride: 107 mmol/L (ref 98–110)
Creat: 1 mg/dL — ABNORMAL HIGH (ref 0.50–0.99)
Globulin: 1.8 g/dL (calc) — ABNORMAL LOW (ref 1.9–3.7)
Glucose, Bld: 88 mg/dL (ref 65–99)
Potassium: 4.1 mmol/L (ref 3.5–5.3)
Sodium: 139 mmol/L (ref 135–146)
Total Bilirubin: 0.4 mg/dL (ref 0.2–1.2)
Total Protein: 6 g/dL — ABNORMAL LOW (ref 6.1–8.1)

## 2018-12-12 LAB — CBC
HCT: 37.4 % (ref 35.0–45.0)
Hemoglobin: 12.6 g/dL (ref 11.7–15.5)
MCH: 31.7 pg (ref 27.0–33.0)
MCHC: 33.7 g/dL (ref 32.0–36.0)
MCV: 94 fL (ref 80.0–100.0)
MPV: 9.7 fL (ref 7.5–12.5)
Platelets: 292 10*3/uL (ref 140–400)
RBC: 3.98 10*6/uL (ref 3.80–5.10)
RDW: 12.4 % (ref 11.0–15.0)
WBC: 4.2 10*3/uL (ref 3.8–10.8)

## 2018-12-12 LAB — TSH: TSH: 2.11 mIU/L (ref 0.40–4.50)

## 2018-12-12 LAB — VITAMIN D 25 HYDROXY (VIT D DEFICIENCY, FRACTURES): Vit D, 25-Hydroxy: 44 ng/mL (ref 30–100)

## 2018-12-12 MED ORDER — ESTRADIOL 0.1 MG/24HR TD PTTW: 1.0000 | MEDICATED_PATCH | TRANSDERMAL | 4 refills | Status: DC

## 2018-12-12 NOTE — Progress Notes (Signed)
Cheyenne Gallegos Aug 13, 1955 [id number]   History:    63 y.o. G1P1L1 Married  RP:  Established patient presenting for annual gyn exam   HPI: S/P total hysterectomy more than 20 years ago.  Menopause, well on estradiol patch 0.1 twice a week.  On hormone replacement therapy since 2011.  No pelvic pain.  No pain with intercourse.  Breasts normal.  Body mass index good at 20.82.  Good fitness and healthy nutrition.  Health labs here today.  Past medical history,surgical history, family history and social history were all reviewed and documented in the EPIC chart.  Gynecologic History No LMP recorded. Patient has had a hysterectomy. Contraception: status post hysterectomy Last Pap: 01/2018. Results were: ASCUS/HPV HR neg Last mammogram: 12/2017. Results were: Negative Bone Density: 02/2018 Osteopenia T-Score -1.6 at the Spine Colonoscopy: 2018  Obstetric History OB History  Gravida Para Term Preterm AB Living  1 1 1     1   SAB TAB Ectopic Multiple Live Births          1    # Outcome Date GA Lbr Len/2nd Weight Sex Delivery Anes PTL Lv  1 Term     M Vag-Spont  N LIV     ROS: A ROS was performed and pertinent positives and negatives are included in the history.  GENERAL: No fevers or chills. HEENT: No change in vision, no earache, sore throat or sinus congestion. NECK: No pain or stiffness. CARDIOVASCULAR: No chest pain or pressure. No palpitations. PULMONARY: No shortness of breath, cough or wheeze. GASTROINTESTINAL: No abdominal pain, nausea, vomiting or diarrhea, melena or bright red blood per rectum. GENITOURINARY: No urinary frequency, urgency, hesitancy or dysuria. MUSCULOSKELETAL: No joint or muscle pain, no back pain, no recent trauma. DERMATOLOGIC: No rash, no itching, no lesions. ENDOCRINE: No polyuria, polydipsia, no heat or cold intolerance. No recent change in weight. HEMATOLOGICAL: No anemia or easy bruising or bleeding. NEUROLOGIC: No headache, seizures, numbness,  tingling or weakness. PSYCHIATRIC: No depression, no loss of interest in normal activity or change in sleep pattern.     Exam:   BP 130/78   Ht 5\' 6"  (1.676 m)   Wt 129 lb (58.5 kg)   BMI 20.82 kg/m   Body mass index is 20.82 kg/m.  General appearance : Well developed well nourished female. No acute distress HEENT: Eyes: no retinal hemorrhage or exudates,  Neck supple, trachea midline, no carotid bruits, no thyroidmegaly Lungs: Clear to auscultation, no rhonchi or wheezes, or rib retractions  Heart: Regular rate and rhythm, no murmurs or gallops Breast:Examined in sitting and supine position were symmetrical in appearance, no palpable masses or tenderness,  no skin retraction, no nipple inversion, no nipple discharge, no skin discoloration, no axillary or supraclavicular lymphadenopathy Abdomen: no palpable masses or tenderness, no rebound or guarding Extremities: no edema or skin discoloration or tenderness  Pelvic: Vulva: Normal             Vagina: No gross lesions or discharge  Cervix/Uterus absent  Adnexa  Without masses or tenderness  Anus: Normal   Assessment/Plan:  63 y.o. female for annual exam   1. Encounter for Papanicolaou smear of vagina as part of routine gynecological examination Gynecologic exam status post total hysterectomy.  Pap test November 2019 showed ASCUS with high-risk HPV negative.  Pap reflex done today on the vaginal vault.  Breast exam normal.  Will schedule screening mammogram in October 2020.  Health labs here today.  Colonoscopy in 2018.  2. S/P total hysterectomy  3. Postmenopausal hormone replacement therapy Well on hormone replacement therapy since 2011.  No contraindication to continue.  Estradiol 0.1 patch twice a week represcribed.  4. Osteopenia of multiple sites Bone density December 2019 showed osteopenia with a T score at -1.6 at the spine.  Continue with vitamin D supplements, calcium intake of 1200 mg daily and regular weightbearing  physical activities.  Other orders - estradiol (VIVELLE-DOT) 0.1 MG/24HR patch; Place 1 patch (0.1 mg total) onto the skin 2 (two) times a week.  Genia DelMarie-Lyne Roxanne Panek MD, 8:48 AM 12/12/2018

## 2018-12-14 ENCOUNTER — Encounter: Payer: Self-pay | Admitting: Obstetrics & Gynecology

## 2018-12-14 NOTE — Patient Instructions (Signed)
1. Encounter for Papanicolaou smear of vagina as part of routine gynecological examination Gynecologic exam status post total hysterectomy.  Pap test November 2019 showed ASCUS with high-risk HPV negative.  Pap reflex done today on the vaginal vault.  Breast exam normal.  Will schedule screening mammogram in October 2020.  Health labs here today.  Colonoscopy in 2018.  2. S/P total hysterectomy  3. Postmenopausal hormone replacement therapy Well on hormone replacement therapy since 2011.  No contraindication to continue.  Estradiol 0.1 patch twice a week represcribed.  4. Osteopenia of multiple sites Bone density December 2019 showed osteopenia with a T score at -1.6 at the spine.  Continue with vitamin D supplements, calcium intake of 1200 mg daily and regular weightbearing physical activities.  Other orders - estradiol (VIVELLE-DOT) 0.1 MG/24HR patch; Place 1 patch (0.1 mg total) onto the skin 2 (two) times a week.  Imogine, it was a pleasure seeing you today!  I will inform you of your results as soon as they are available.

## 2018-12-16 LAB — PAP IG W/ RFLX HPV ASCU

## 2018-12-16 LAB — HUMAN PAPILLOMAVIRUS, HIGH RISK: HPV DNA High Risk: NOT DETECTED

## 2018-12-23 ENCOUNTER — Telehealth: Payer: Self-pay

## 2018-12-23 NOTE — Telephone Encounter (Signed)
Covid-19 screening questions   Do you now or have you had a fever in the last 14 days?  Do you have any respiratory symptoms of shortness of breath or cough now or in the last 14 days?  Do you have any family members or close contacts with diagnosed or suspected Covid-19 in the past 14 days?  Have you been tested for Covid-19 and found to be positive?       

## 2018-12-24 ENCOUNTER — Ambulatory Visit (AMBULATORY_SURGERY_CENTER): Payer: Managed Care, Other (non HMO) | Admitting: Gastroenterology

## 2018-12-24 ENCOUNTER — Other Ambulatory Visit: Payer: Self-pay

## 2018-12-24 ENCOUNTER — Encounter: Payer: Self-pay | Admitting: Gastroenterology

## 2018-12-24 ENCOUNTER — Encounter: Payer: Self-pay | Admitting: Gynecology

## 2018-12-24 VITALS — BP 113/66 | HR 65 | Temp 97.7°F | Resp 17 | Ht 66.0 in | Wt 131.0 lb

## 2018-12-24 DIAGNOSIS — K317 Polyp of stomach and duodenum: Secondary | ICD-10-CM | POA: Diagnosis not present

## 2018-12-24 DIAGNOSIS — K229 Disease of esophagus, unspecified: Secondary | ICD-10-CM

## 2018-12-24 DIAGNOSIS — K2970 Gastritis, unspecified, without bleeding: Secondary | ICD-10-CM

## 2018-12-24 DIAGNOSIS — K449 Diaphragmatic hernia without obstruction or gangrene: Secondary | ICD-10-CM

## 2018-12-24 DIAGNOSIS — K22719 Barrett's esophagus with dysplasia, unspecified: Secondary | ICD-10-CM | POA: Diagnosis present

## 2018-12-24 MED ORDER — SODIUM CHLORIDE 0.9 % IV SOLN: 500.0000 mL | Freq: Once | INTRAVENOUS | Status: DC

## 2018-12-24 NOTE — Progress Notes (Signed)
Called to room to assist during endoscopic procedure.  Patient ID and intended procedure confirmed with present staff. Received instructions for my participation in the procedure from the performing physician.  

## 2018-12-24 NOTE — Patient Instructions (Signed)
Please read handouts provided. Await pathology results. Continue Prevacid 30 mg once a day. No aspirin, ibuprofen, naproxen, or other non-steriodal anti-inflammatory drugs for 5 days.      YOU HAD AN ENDOSCOPIC PROCEDURE TODAY AT [facility]:   Refer to the procedure report that was given to you for any specific questions about what was found during the examination.  If the procedure report does not answer your questions, please call your gastroenterologist to clarify.  If you requested that your care partner not be given the details of your procedure findings, then the procedure report has been included in a sealed envelope for you to review at your convenience later.  YOU SHOULD EXPECT: Some feelings of bloating in the abdomen. Passage of more gas than usual.  Walking can help get rid of the air that was put into your GI tract during the procedure and reduce the bloating. If you had a lower endoscopy (such as a colonoscopy or flexible sigmoidoscopy) you may notice spotting of blood in your stool or on the toilet paper. If you underwent a bowel prep for your procedure, you may not have a normal bowel movement for a few days.  Please Note:  You might notice some irritation and congestion in your nose or some drainage.  This is from the oxygen used during your procedure.  There is no need for concern and it should clear up in a day or so.  SYMPTOMS TO REPORT IMMEDIATELY:    Following upper endoscopy (EGD)  Vomiting of blood or coffee ground material  New chest pain or pain under the shoulder blades  Painful or persistently difficult swallowing  New shortness of breath  Fever of 100F or higher  Black, tarry-looking stools  For urgent or emergent issues, a gastroenterologist can be reached at any hour by calling [phone].   DIET:  We do recommend a small meal at first, but then you may proceed to your regular diet.  Drink plenty of fluids but you should avoid  alcoholic beverages for 24 hours.  ACTIVITY:  You should plan to take it easy for the rest of today and you should NOT DRIVE or use heavy machinery until tomorrow (because of the sedation medicines used during the test).    FOLLOW UP: Our staff will call the number listed on your records 48-72 hours following your procedure to check on you and address any questions or concerns that you may have regarding the information given to you following your procedure. If we do not reach you, we will leave a message.  We will attempt to reach you two times.  During this call, we will ask if you have developed any symptoms of COVID 19. If you develop any symptoms (ie: fever, flu-like symptoms, shortness of breath, cough etc.) before then, please call [phone].  If you test positive for Covid 19 in the 2 weeks post procedure, please call and report this information to [place].    If any biopsies were taken you will be contacted by phone or by letter within the next 1-3 weeks.  Please call us at [phone] if you have not heard about the biopsies in 3 weeks.    SIGNATURES/CONFIDENTIALITY: You and/or your care partner have signed paperwork which will be entered into your electronic medical record.  These signatures attest to the fact that that the information above on your After Visit Summary has been reviewed and is understood.  Full responsibility of the confidentiality of  this discharge information lies with you and/or your care-partner.

## 2018-12-24 NOTE — Progress Notes (Signed)
Temp check by JB/Vital check by CW.  Patient states no medical or surgical history changes since pre-visit screening on 12/10/18.

## 2018-12-24 NOTE — Op Note (Signed)
[facility] Patient Name: Cheyenne Gallegos Procedure Date: 12/24/2018 8:37 AM MRN: [id number] Endoscopist: Lynann Bologna , MD Age: 63 Referring MD:  Date of Birth: 07-28-1955 Gender: Female Account #: [id number] Procedure:                Upper GI endoscopy Indications:              Previously diagnosed short segment Barrett's                            esophagus Medicines:                Monitored Anesthesia Care Procedure:                Pre-Anesthesia Assessment:                           - Prior to the procedure, a History and Physical                            was performed, and patient medications and                            allergies were reviewed. The patient's tolerance of                            previous anesthesia was also reviewed. The risks                            and benefits of the procedure and the sedation                            options and risks were discussed with the patient.                            All questions were answered, and informed consent                            was obtained. Prior Anticoagulants: The patient has                            taken no previous anticoagulant or antiplatelet                            agents. ASA Grade Assessment: II - A patient with                            mild systemic disease. After reviewing the risks                            and benefits, the patient was deemed in                            satisfactory condition to undergo the procedure.  After obtaining informed consent, the endoscope was                            passed under direct vision. Throughout the                            procedure, the patient's blood pressure, pulse, and                            oxygen saturations were monitored continuously. The                            Endoscope was introduced through the mouth, and                            advanced to the second part of duodenum. The upper                             GI endoscopy was accomplished without difficulty.                            The patient tolerated the procedure well. Scope In: Scope Out: Findings:                 The Z-line was irregular and was found 38 cm from                            the incisors with few islands of salmon-colored                            mucosa. Examined by NBI. Biopsies were taken with a                            cold forceps for histology, directed by NBI.                            Estimated blood loss: none. Incidental note was                            made of a small inlet patch in the proximal                            esophagus.                           A small transient hiatal hernia was noted on full                            distention of the lower esophagus.                           A single 6 mm sessile polyp with no bleeding and no  stigmata of recent bleeding was found at the                            gastroesophageal junction. The polyp was removed                            with a hot snare. Resection and retrieval were                            complete.                           Localized mild inflammation characterized by                            erythema was found in the gastric antrum. Biopsies                            were taken with a cold forceps for histology.                           The examined duodenum was normal. Complications:            No immediate complications. Estimated Blood Loss:     Estimated blood loss: none. Impression:               - Z-line irregular, 38 cm from the incisors.                            Biopsied.                           - Small transient hiatal hernia.                           - Gastric polyp S/P polypectomy                           - Minimal gastritis. Recommendation:           - Resume previous diet.                           - Continue Prevacid 30 mg p.o. once a day.                            - Await pathology results.                           - No aspirin, ibuprofen, naproxen, or other                            non-steroidal anti-inflammatory drugs for 5 days                            after polyp removal.                           -  The findings and recommendations were discussed                            with the patient's husband Roe CoombsDon. Lynann Bolognaajesh Yonatan Guitron, MD 12/24/2018 9:07:59 AM This report has been signed electronically.

## 2018-12-24 NOTE — Progress Notes (Signed)
PT taken to PACU. Monitors in place. VSS. Report given to RN. 

## 2018-12-26 ENCOUNTER — Telehealth: Payer: Self-pay

## 2018-12-26 NOTE — Telephone Encounter (Signed)
  Follow up Call-  Call back number 12/24/2018  Post procedure Call Back phone  # [phone]  Permission to leave phone message Yes  Some recent data might be hidden     Patient questions:  Do you have a fever, pain , or abdominal swelling? No. Pain Score  0 *  Have you tolerated food without any problems? Yes.    Have you been able to return to your normal activities? Yes.    Do you have any questions about your discharge instructions: Diet   No. Medications  No. Follow up visit  No.  Do you have questions or concerns about your Care? No.  Actions: * If pain score is 4 or above: No action needed, pain <4.  1. Have you developed a fever since your procedure? no  2.   Have you had an respiratory symptoms (SOB or cough) since your procedure? no  3.   Have you tested positive for COVID 19 since your procedure no  4.   Have you had any family members/close contacts diagnosed with the COVID 19 since your procedure? no  If yes to any of these questions please route to Joylene John, RN and Alphonsa Gin, Therapist, sports.

## 2018-12-29 ENCOUNTER — Encounter: Payer: Self-pay | Admitting: Gastroenterology

## 2018-12-30 ENCOUNTER — Other Ambulatory Visit: Payer: Self-pay

## 2018-12-31 ENCOUNTER — Encounter: Payer: Self-pay | Admitting: Obstetrics & Gynecology

## 2018-12-31 ENCOUNTER — Ambulatory Visit: Payer: Managed Care, Other (non HMO) | Admitting: Obstetrics & Gynecology

## 2018-12-31 VITALS — BP 154/88

## 2018-12-31 DIAGNOSIS — R87620 Atypical squamous cells of undetermined significance on cytologic smear of vagina (ASC-US): Secondary | ICD-10-CM

## 2018-12-31 DIAGNOSIS — Z90710 Acquired absence of both cervix and uterus: Secondary | ICD-10-CM

## 2018-12-31 DIAGNOSIS — Z87410 Personal history of cervical dysplasia: Secondary | ICD-10-CM

## 2018-12-31 NOTE — Patient Instructions (Signed)
1. 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  HPV high-risk negative both times.  Colposcopy procedure discussed.  Colposcopy findings reviewed with patient.  Known post colposcopy precautions.  Will decide on management per vaginal biopsy results.  2. S/P total hysterectomy  3. History of cervical dysplasia LEEP, then Total Hysterectomy.  H/O Vulvar Dysplasia as well.  Cheyenne Gallegos, it was a pleasure seeing you today!  I will inform you of your results as soon as they are available.

## 2018-12-31 NOTE — Progress Notes (Signed)
    Cheyenne Gallegos [birth year] [id number]        63 y.o.  G1P1001   RP: ASCUS x 2  HPI: ASCUS x 2 with negative HR HPV.  S/P Total Hysterectomy.  H/O Severe Cervical Dysplasia/LEEP and H/O Vulvar Dysplasia.   OB History  Gravida Para Term Preterm AB Living  1 1 1     1   SAB TAB Ectopic Multiple Live Births          1    # Outcome Date GA Lbr Len/2nd Weight Sex Delivery Anes PTL Lv  1 Term     M Vag-Spont  N LIV    Past medical history,surgical history, problem list, medications, allergies, family history and social history were all reviewed and documented in the EPIC chart.   Directed ROS with pertinent positives and negatives documented in the history of present illness/assessment and plan.  Exam:  Vitals:   12/31/18 0852  BP: (!) 154/88   General appearance:  Normal  Colposcopy Procedure Note JENISE IANNELLI 12/31/2018  Indications: ASCUS x 2  Procedure Details  The risks and benefits of the procedure and Verbal informed consent obtained.  Speculum placed in vagina and excellent visualization of the vaginal vault.  Acetic Acid applied on the vagina.  Findings:  Vaginal colposcopy:  Mild AW with punctation at the mid left vaginal vault.  Bx of vagina at that location.  Silver Nitrate for hemostasis.  Vulvar colposcopy:  Normal  Perirectal colposcopy: Grossly normal  The cervix was sprayed with Hurricane before performing the vaginal biopsy.  Specimens: Vaginal biopsy at mid left vaginal vault.  Complications:  None, hemostasis with Silver Nitrate . Plan:  Management per results   Assessment/Plan:  63 y.o. G1P1001   1. 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  HPV high-risk negative both times.  Colposcopy procedure discussed.  Colposcopy findings reviewed with patient.  Known post colposcopy precautions.  Will decide on management per  vaginal biopsy results.  2. S/P total hysterectomy  3. History of cervical dysplasia LEEP, then Total Hysterectomy.  H/O Vulvar Dysplasia as well.  Princess Bruins MD, 9:18 AM 12/31/2018

## 2018-12-31 NOTE — Addendum Note (Signed)
Addended by: Thurnell Garbe A on: 12/31/2018 10:46 AM   Modules accepted: Orders

## 2019-01-02 LAB — TISSUE SPECIMEN

## 2019-01-02 LAB — PATHOLOGY REPORT

## 2019-01-14 ENCOUNTER — Other Ambulatory Visit: Payer: Self-pay | Admitting: Internal Medicine

## 2019-01-16 ENCOUNTER — Other Ambulatory Visit: Payer: Self-pay

## 2019-01-16 ENCOUNTER — Ambulatory Visit
Admission: RE | Admit: 2019-01-16 | Discharge: 2019-01-16 | Disposition: A | Discharge status: Home / Self Care | Payer: Managed Care, Other (non HMO) | Source: Ambulatory Visit | Attending: Obstetrics & Gynecology | Admitting: Obstetrics & Gynecology

## 2019-01-16 DIAGNOSIS — Z1231 Encounter for screening mammogram for malignant neoplasm of breast: Secondary | ICD-10-CM

## 2019-01-20 ENCOUNTER — Telehealth: Payer: Self-pay | Admitting: Internal Medicine

## 2019-01-20 MED ORDER — IPRATROPIUM BROMIDE 0.06 % NA SOLN: NASAL | 4 refills | Status: DC

## 2019-01-20 NOTE — Telephone Encounter (Signed)
Spoke with pt. She is needing a refill on Ipratropium nasal spray. Rx has been sent in. Nothing further was needed.

## 2019-02-06 ENCOUNTER — Other Ambulatory Visit: Payer: Self-pay | Admitting: Gastroenterology

## 2019-05-21 ENCOUNTER — Ambulatory Visit: Payer: Managed Care, Other (non HMO) | Admitting: Cardiovascular Disease

## 2019-05-21 ENCOUNTER — Other Ambulatory Visit: Payer: Self-pay

## 2019-05-21 ENCOUNTER — Encounter: Payer: Self-pay | Admitting: Cardiovascular Disease

## 2019-05-21 VITALS — BP 130/78 | HR 73 | Temp 96.9°F | Ht 66.0 in | Wt 131.8 lb

## 2019-05-21 DIAGNOSIS — I10 Essential (primary) hypertension: Secondary | ICD-10-CM

## 2019-05-21 DIAGNOSIS — E7800 Pure hypercholesterolemia, unspecified: Secondary | ICD-10-CM

## 2019-05-21 NOTE — Patient Instructions (Signed)
Medication Instructions:  Your physician recommends that you continue on your current medications as directed. Please refer to the Current Medication list given to you today.  *If you need a refill on your cardiac medications before your next appointment, please call your pharmacy*  Lab Work: NONE  Testing/Procedures: NONE  Follow-Up: At [facility], you and your health needs are our priority.  As part of our continuing mission to provide you with exceptional heart care, we have created designated Provider Care Teams.  These Care Teams include your primary Cardiologist (physician) and Advanced Practice Providers (APPs -  Physician Assistants and Nurse Practitioners) who all work together to provide you with the care you need, when you need it.  Your next appointment:   12 month(s)  The format for your next appointment:   In Person  Provider:   Thurmon Fair, MD  Other Instructions Kardia by [employer]-EKG device for your phone

## 2019-05-21 NOTE — Progress Notes (Signed)
Patient ID: Cheyenne Gallegos, female   DOB: Sep 29, 1955, 64 y.o.   MRN: [id number]    Cardiology Office Note    Date:  05/21/2019   ID:  Cheyenne Gallegos, DOB Jun 15, 1955, MRN [id number]  PCP:  Myrlene Broker, MD  Cardiologist:  Sanda Klein, MD   Chief Complaint  Patient presents with  . Palpitations    Finger/toes blanching    History of Present Illness:  Cheyenne Gallegos is a 64 y.o. female with complaints of occasional intermittent palpitations.   The palpitations are less frequent than when she was last seen in clinic, but still occur intermittently without a clear trigger.  She is concerned because her mother had a stroke from atrial fibrillation.  She has never had sustained palpitations.  They are usually interspersed with long periods of normal rhythm.  She complains about her fingers and toes blanching, especially in contact with cold.  Sometimes her toes also turn purple.  She has numbness and tingling until the color returns to normal.  Warm showers help.  She has occasional chest discomfort that can occur when she is lying flat in bed, sometimes even waking her from sleep.  It resolves promptly if she takes a drink of water.  She is taking a PPI.  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  She has just received her first shot of the COVID-19 vaccine.  Significant comorbid conditions include Barrett's esophagus and mild COPD. She had a normal nuclear stress test in 2011 with an ejection fraction of 62%.  Past Medical History:  Diagnosis Date  . ADD (attention deficit disorder with hyperactivity)   . Anemia    PAST HX   . Arthritis    thumbs  . Barretts esophagus   . Condyloma    vulvar  . COPD (chronic obstructive pulmonary disease) (HCC)    mild   . GERD (gastroesophageal reflux disease)   . Hypercholesteremia   . Insomnia   . Nausea   . Normal spontaneous vaginal delivery   . Osteopenia   . VIN I  (vulvar intraepithelial neoplasia I)    left labia  . VIN III (vulvar intraepithelial neoplasia III)   . Vitamin D deficiency     Past Surgical History:  Procedure Laterality Date  . BREAST BIOPSY  [phone]  . BREAST EXCISIONAL BIOPSY Right 1979  . BREAST EXCISIONAL BIOPSY Right 1980  . BREAST EXCISIONAL BIOPSY Right 1983  . BREAST EXCISIONAL BIOPSY Right 1986  . CARDIOVASCULAR STRESS TEST  12/17/2009   Images showed normal pattern of perfusion in all regions. EKG is negative for ischemia.  Marland Kitchen co2 laser of labia    . COLONOSCOPY  01/05/2017   Small internal hemorrhoids. Otherwise normal colonoscopy.   . ESOPHAGOGASTRODUODENOSCOPY  08/20/2015   Barretts esophagus. Gastric poylp.  Marland Kitchen FOOT SURGERY  2001  . HAND SURGERY Right Apr 28 2014  . TOTAL VAGINAL HYSTERECTOMY  1995  . TUBAL LIGATION    . UPPER GASTROINTESTINAL ENDOSCOPY    . VULVA SURGERY      Outpatient Medications Prior to Visit  Medication Sig Dispense Refill  . calcium carbonate (OS-CAL) 600 MG TABS Take 600 mg by mouth 2 (two) times daily with a meal.    . Cetirizine HCl (ZYRTEC PO) Take 1 tablet by mouth daily.     . cholecalciferol (VITAMIN D3) 25 MCG (1000 UT) tablet Take 2,000 Units by mouth daily.    . Cyanocobalamin (VITAMIN  B 12) 250 MCG LOZG Take by mouth daily.    Marland Kitchen estradiol (VIVELLE-DOT) 0.1 MG/24HR patch Place 1 patch (0.1 mg total) onto the skin 2 (two) times a week. 24 patch 4  . ipratropium (ATROVENT) 0.06 % nasal spray USE 2 SPRAYS IN EACH NOSTRIL FOUR TIMES DAILY 45 mL 4  . lansoprazole (PREVACID) 30 MG capsule TAKE ONE CAPSULE BY MOUTH EVERY DAY AT NOON 30 capsule 11  . lisdexamfetamine (VYVANSE) 70 MG capsule Take 70 mg by mouth daily.    . Magnesium 400 MG CAPS Take 800 mg by mouth.    . Melatonin 10 MG CAPS Take by mouth.    . Multiple Vitamins-Minerals (PRESERVISION AREDS PO) Take 1 tablet by mouth 2 (two) times daily.    . Probiotic Product (PROBIOTIC DAILY PO) Take 1 tablet by mouth daily.     Marland Kitchen rOPINIRole (REQUIP) 1 MG tablet Take 1 mg by mouth 3 (three) times daily.    . valACYclovir (VALTREX) 1000 MG tablet TK 2 TS PO BID FOR 1 DAY WITH ONSET OF COLD SORE    . vitamin C (ASCORBIC ACID) 500 MG tablet Take 500 mg by mouth daily.    . vitamin E 400 UNIT capsule Take 400 Units by mouth daily.     No facility-administered medications prior to visit.     Allergies:   Morphine   Social History   Socioeconomic History  . Marital status: Married    Spouse name: Not on file  . Number of children: Not on file  . Years of education: Not on file  . Highest education level: Not on file  Occupational History  . Occupation: superivsor at [employer]  . Smoking status: Former Smoker    Packs/day: 1.50    Years: 28.00    Pack years: 42.00    Types: Cigarettes    Quit date: 02/05/2000    Years since quitting: 19.3  . Smokeless tobacco: Never Used  Substance and Sexual Activity  . Alcohol use: Yes    Alcohol/week: 1.0 standard drinks    Types: 1 Standard drinks or equivalent per week    Comment: rare  . Drug use: No  . Sexual activity: Yes    Partners: Male    Comment: 1st intercourse 64 yo-Fewer than 5 partners  Other Topics Concern  . Not on file  Social History Narrative  . Not on file   Social Determinants of Health   Financial Resource Strain:   . Difficulty of Paying Living Expenses: Not on file  Food Insecurity:   . Worried About Programme researcher, broadcasting/film/video in the Last Year: Not on file  . Ran Out of Food in the Last Year: Not on file  Transportation Needs:   . Lack of Transportation (Medical): Not on file  . Lack of Transportation (Non-Medical): Not on file  Physical Activity:   . Days of Exercise per Week: Not on file  . Minutes of Exercise per Session: Not on file  Stress:   . Feeling of Stress : Not on file  Social Connections:   . Frequency of Communication with Friends and Family: Not on file  . Frequency of Social Gatherings with Friends and Family:  Not on file  . Attends Religious Services: Not on file  . Active Member of Clubs or Organizations: Not on file  . Attends Banker Meetings: Not on file  . Marital Status: Not on file     Family History:  The patient's family history includes Arrhythmia in her mother; COPD in her brother; Cancer in her paternal grandmother; Chronic Renal Failure in her mother; Colon cancer in her paternal grandmother; Diabetes in her mother; Heart attack in her maternal grandfather; Heart disease in her mother; Heart failure in her maternal grandmother; Hypertension in her mother; Lung disease in her brother.   ROS:   Please see the history of present illness.    ROS All other systems reviewed and are negative.   PHYSICAL EXAM:   VS:  BP 130/78   Pulse 73   Temp (!) 96.9 F (36.1 C)   Ht 5\' 6"  (1.676 m)   Wt 131 lb 12.8 oz (59.8 kg)   SpO2 99%   BMI 21.27 kg/m     General: Alert, oriented x3, no distress, slender and fit Head: no evidence of trauma, PERRL, EOMI, no exophtalmos or lid lag, no myxedema, no xanthelasma; normal ears, nose and oropharynx Neck: normal jugular venous pulsations and no hepatojugular reflux; brisk carotid pulses without delay and no carotid bruits Chest: clear to auscultation, no signs of consolidation by percussion or palpation, normal fremitus, symmetrical and full respiratory excursions Cardiovascular: normal position and quality of the apical impulse, regular rhythm, normal first and second heart sounds, no murmurs, rubs or gallops Abdomen: no tenderness or distention, no masses by palpation, no abnormal pulsatility or arterial bruits, normal bowel sounds, no hepatosplenomegaly Extremities: no clubbing, cyanosis or edema; 2+ radial, ulnar and brachial pulses bilaterally; 2+ right femoral, posterior tibial and dorsalis pedis pulses; 2+ left femoral, posterior tibial and dorsalis pedis pulses; no subclavian or femoral bruits Neurological: grossly  nonfocal Psych: Normal mood and affect   Wt Readings from Last 3 Encounters:  05/21/19 131 lb 12.8 oz (59.8 kg)  12/24/18 131 lb (59.4 kg)  12/12/18 129 lb (58.5 kg)      Studies/Labs Reviewed:   EKG:  EKG is ordered today.  It shows NSR, normal tracing  Recent Labs: 12/12/2018: ALT 19; BUN 20; Creat 1.00; Hemoglobin 12.6; Platelets 292; Potassium 4.1; Sodium 139; TSH 2.11   Lipid Panel    Component Value Date/Time   CHOL 196 12/12/2018 0912   TRIG 62 12/12/2018 0912   HDL 63 12/12/2018 0912   CHOLHDL 3.1 12/12/2018 0912   VLDL 22 12/20/2015 0941   LDLCALC 118 (H) 12/12/2018 0912    ASSESSMENT:    1. Essential hypertension   2. Hypercholesterolemia      PLAN:  In order of problems listed above:  1. PACs and PVCs: No evidence of structural heart disease clinically or by previous work-up.  Even if she has atrial fibrillation she would not yet qualify for anticoagulation (CHA2DS2-VASc 1 for gender only).  Asked her to consider purchasing a home ECG monitor such as a cardiac for Apple Watch since it is likely that these complaints will persist and worsen as she gets older.   2. HLP: No longer taking statin.  Her most recent lipid profile shows an excellent HDL and an acceptable LDL for somebody without known vascular disease.  If the issue comes up again of need for statin therapy.  Improve the decision making with a coronary calcium score. 3. Raynaud's syndrome: gives a very typical description for this.  Does not have other signs or symptoms to suggest autoimmune disease.  Good reason not to give her unopposed beta-blockers.  She prefers not to take calcium channel blocker unless her symptoms worsen.   Medication Adjustments/Labs and Tests Ordered: Current  medicines are reviewed at length with the patient today.  Concerns regarding medicines are outlined above.  Medication changes, Labs and Tests ordered today are listed in the Patient Instructions below. Patient  Instructions  Medication Instructions:  Your physician recommends that you continue on your current medications as directed. Please refer to the Current Medication list given to you today.  *If you need a refill on your cardiac medications before your next appointment, please call your pharmacy*  Lab Work: NONE  Testing/Procedures: NONE  Follow-Up: At [facility], you and your health needs are our priority.  As part of our continuing mission to provide you with exceptional heart care, we have created designated Provider Care Teams.  These Care Teams include your primary Cardiologist (physician) and Advanced Practice Providers (APPs -  Physician Assistants and Nurse Practitioners) who all work together to provide you with the care you need, when you need it.  Your next appointment:   12 month(s)  The format for your next appointment:   In Person  Provider:   Thurmon Fair, MD  Other Instructions Kardia by [employer]-EKG device for your phone       Signed, Thurmon Fair, MD  05/21/2019 10:02 AM    [facility] [address] [place], [place], [place]  [address] Phone: [phone]; Fax: [phone]

## 2019-06-18 ENCOUNTER — Ambulatory Visit: Payer: Managed Care, Other (non HMO) | Admitting: Cardiovascular Disease

## 2019-10-14 ENCOUNTER — Encounter: Payer: Self-pay | Admitting: Internal Medicine

## 2019-10-14 ENCOUNTER — Ambulatory Visit: Payer: Managed Care, Other (non HMO) | Admitting: Internal Medicine

## 2019-10-14 ENCOUNTER — Ambulatory Visit (INDEPENDENT_AMBULATORY_CARE_PROVIDER_SITE_OTHER): Payer: Managed Care, Other (non HMO)

## 2019-10-14 ENCOUNTER — Other Ambulatory Visit: Payer: Self-pay

## 2019-10-14 VITALS — BP 124/62 | HR 77 | Temp 98.1°F | Ht 66.0 in | Wt 132.2 lb

## 2019-10-14 DIAGNOSIS — J449 Chronic obstructive pulmonary disease, unspecified: Secondary | ICD-10-CM | POA: Diagnosis not present

## 2019-10-14 DIAGNOSIS — J310 Chronic rhinitis: Secondary | ICD-10-CM

## 2019-10-14 MED ORDER — BREZTRI AEROSPHERE 160-9-4.8 MCG/ACT IN AERO: 2.0000 | INHALATION_SPRAY | Freq: Two times a day (BID) | RESPIRATORY_TRACT | 0 refills | Status: DC

## 2019-10-14 NOTE — Progress Notes (Signed)
HPI female former smoker followed for COPD, rhinitis, complicated by GERD/Barrett's Her brother [name] had been followed here for chronic respiratory failure with congestive heart failure and history of pulmonary emboli and bullous emphysema. She thought he had an alpha-1 antitrypsin abnormality  a1AT 02/19/13- was normal MM PFT 02/18/13-minimal obstructive airways disease, no response to bronchodilator, FVC 4.13/114%, FEV1 2.32/82%, ratio 0.56, FEF 25-75% 1.05/41%, DLCO 70%, TLC 118% Office Spirometry 08/13/2017-mild obstruction.  FVC 3.67/105%, FEV1 2.12/79%, ratio 0.58, FEF 25-75% 0.98/41% -----------------------------------------------------------------------------------------  10/14/2018- 64 year old female former smoker followed for COPD, rhinitis, complicated by GERD/Barrett's -----pt states breathing is at baseline, has albuterol rescue inhaler available but hasn't had to use it Occasionally feel unable to take satisfying deep breath, but no sustained or exertional concern.  Working for [employer].  10/14/19- 64 year old female former smoker followed for COPD, rhinitis, complicated by GERD/Barrett's Atrovent 0.06% nasal,  Easier DOE, esp carrying weight in arms, stairs etc.Little wheeze or cough.  Had 2 Moderna Covax Nasal drainage controlled fairly well with Atrovent spray.Discussed trial of Nasalcrom. We reviewed CXR images and discussed emphysema.  CXR 10/14/2018- IMPRESSION: Emphysema and pulmonary hyperinflation. No acute abnormality of the lungs.    ROS-see HPI + = positive Constitutional:   No-   weight loss, night sweats, fevers, chills, fatigue, lassitude. HEENT:   No-  headaches, difficulty swallowing, tooth/dental problems, sore throat,       No-  sneezing, itching, ear ache,  nasal congestion, post nasal drip,  CV:  No-   chest pain, orthopnea, PND, swelling in lower extremities, anasarca, dizziness, palpitations Resp: +Per HPI- shortness of breath with  exertion or at rest.              No-   productive cough,   non-productive cough,  No- coughing up of blood.              No-   change in color of mucus.   wheezing.   Skin: No-   rash or lesions. GI:  + heartburn, indigestion, No-abdominal pain, nausea, vomiting,  GU:  MS:  No-   joint pain or swelling.   Neuro-     nothing unusual Psych:  No- change in mood or affect. No depression or anxiety.  No memory loss.  OBJ General- Alert, Oriented, Affect-appropriate, Distress- none acute; well-appearing Skin- rash-none, lesions- none, excoriation- none Lymphadenopathy- none Head- atraumatic            Eyes- Gross vision intact, PERRLA, conjunctivae clear secretions            Ears- Hearing, canals-normal            Nose-clear, no-Septal dev,  polyps,                                                             erosion, perforation             Throat- Mallampati II , mucosa clear , drainage- none, tonsils- atrophic Neck- flexible , trachea midline, no stridor , thyroid nl, carotid no bruit Chest - symmetrical excursion , unlabored           Heart/CV- RRR , no murmur , no gallop  , no rub, nl s1 s2                           -  JVD- none , edema- none, stasis changes- none, varices- none           Lung- clear to P&A/ distant, wheeze- none, cough- none , dullness-none, rub- none           Chest wall-  Abd-  Br/ Gen/ Rectal- Not done, not indicated Extrem- cyanosis- none, clubbing, none, atrophy- none, strength- nl Neuro- grossly intact to observation

## 2019-10-14 NOTE — Assessment & Plan Note (Signed)
Discussed meds Plan- refill Atrovent nasal spray. Try otc Nasalcrom

## 2019-10-14 NOTE — Assessment & Plan Note (Signed)
Increased DOE. Mostly emphysema, but we will update CXR and try samples Breztri. She is still working, doubt [employer] now would add much.  Plan-- CXR, Breztri trial

## 2019-10-14 NOTE — Patient Instructions (Signed)
Sample x 2 Breztri maintenance inhaler    Inhale 2 puffs then rinse mouth, twice daily   If this seems to help, call for a prescription  Script sent refilling atrovent nasal spray  Try otc nasal spray Nasalcrom/Cromolyn/ Cromol- follow directions on box  Order- CXR    Dx COPD mixed type

## 2019-10-16 NOTE — Progress Notes (Signed)
Spoke with pt and notified of results per Dr. Young Pt verbalized understanding and denied any questions. 

## 2019-12-04 ENCOUNTER — Other Ambulatory Visit: Payer: Self-pay | Admitting: Internal Medicine

## 2019-12-11 DIAGNOSIS — F418 Other specified anxiety disorders: Secondary | ICD-10-CM | POA: Insufficient documentation

## 2019-12-11 DIAGNOSIS — Z0289 Encounter for other administrative examinations: Secondary | ICD-10-CM | POA: Insufficient documentation

## 2019-12-22 ENCOUNTER — Ambulatory Visit (INDEPENDENT_AMBULATORY_CARE_PROVIDER_SITE_OTHER): Payer: Managed Care, Other (non HMO) | Admitting: Obstetrics & Gynecology

## 2019-12-22 ENCOUNTER — Other Ambulatory Visit: Payer: Self-pay | Admitting: Obstetrics & Gynecology

## 2019-12-22 ENCOUNTER — Encounter: Payer: Managed Care, Other (non HMO) | Admitting: Obstetrics & Gynecology

## 2019-12-22 ENCOUNTER — Encounter: Payer: Self-pay | Admitting: Obstetrics & Gynecology

## 2019-12-22 ENCOUNTER — Other Ambulatory Visit: Payer: Self-pay

## 2019-12-22 VITALS — BP 122/78 | Ht 66.0 in | Wt 127.0 lb

## 2019-12-22 DIAGNOSIS — M8589 Other specified disorders of bone density and structure, multiple sites: Secondary | ICD-10-CM | POA: Diagnosis not present

## 2019-12-22 DIAGNOSIS — Z90710 Acquired absence of both cervix and uterus: Secondary | ICD-10-CM

## 2019-12-22 DIAGNOSIS — Z79890 Hormone replacement therapy (postmenopausal): Secondary | ICD-10-CM

## 2019-12-22 DIAGNOSIS — Z1231 Encounter for screening mammogram for malignant neoplasm of breast: Secondary | ICD-10-CM

## 2019-12-22 DIAGNOSIS — Z01419 Encounter for gynecological examination (general) (routine) without abnormal findings: Secondary | ICD-10-CM

## 2019-12-22 MED ORDER — ESTRADIOL 0.075 MG/24HR TD PTTW
1.0000 | MEDICATED_PATCH | TRANSDERMAL | 4 refills | Status: DC
Start: 2019-12-22 — End: 2020-12-22

## 2019-12-22 NOTE — Progress Notes (Signed)
Cheyenne Gallegos 12-30-1955 [id number]   History:    64 y.o. G1P1L1 Married  RP:  Established patient presenting for annual gyn exam   HPI: S/P total hysterectomy more than 20 years ago.  Menopause, well on estradiol patch 0.1 twice a week.  On hormone replacement therapy since 2011.  No pelvic pain.  No pain with intercourse. ASCUS x 2, no HR HPV.  Colpo 12/2018 No dysplasia.  Breasts normal.  Body mass index good at 20.5.  Good fitness and healthy nutrition.  Health labs with Fam MD.   Past medical history,surgical history, family history and social history were all reviewed and documented in the EPIC chart.  Gynecologic History No LMP recorded. Patient has had a hysterectomy.  Obstetric History OB History  Gravida Para Term Preterm AB Living  1 1 1     1   SAB TAB Ectopic Multiple Live Births          1    # Outcome Date GA Lbr Len/2nd Weight Sex Delivery Anes PTL Lv  1 Term     M Vag-Spont  N LIV     ROS: A ROS was performed and pertinent positives and negatives are included in the history.  GENERAL: No fevers or chills. HEENT: No change in vision, no earache, sore throat or sinus congestion. NECK: No pain or stiffness. CARDIOVASCULAR: No chest pain or pressure. No palpitations. PULMONARY: No shortness of breath, cough or wheeze. GASTROINTESTINAL: No abdominal pain, nausea, vomiting or diarrhea, melena or bright red blood per rectum. GENITOURINARY: No urinary frequency, urgency, hesitancy or dysuria. MUSCULOSKELETAL: No joint or muscle pain, no back pain, no recent trauma. DERMATOLOGIC: No rash, no itching, no lesions. ENDOCRINE: No polyuria, polydipsia, no heat or cold intolerance. No recent change in weight. HEMATOLOGICAL: No anemia or easy bruising or bleeding. NEUROLOGIC: No headache, seizures, numbness, tingling or weakness. PSYCHIATRIC: No depression, no loss of interest in normal activity or change in sleep pattern.     Exam:   BP 122/78 (BP Location: Right Arm,  Patient Position: Sitting, Cuff Size: Normal)   Ht 5\' 6"  (1.676 m)   Wt 127 lb (57.6 kg)   BMI 20.50 kg/m   Body mass index is 20.5 kg/m.  General appearance : Well developed well nourished female. No acute distress HEENT: Eyes: no retinal hemorrhage or exudates,  Neck supple, trachea midline, no carotid bruits, no thyroidmegaly Lungs: Clear to auscultation, no rhonchi or wheezes, or rib retractions  Heart: Regular rate and rhythm, no murmurs or gallops Breast:Examined in sitting and supine position were symmetrical in appearance, no palpable masses or tenderness,  no skin retraction, no nipple inversion, no nipple discharge, no skin discoloration, no axillary or supraclavicular lymphadenopathy Abdomen: no palpable masses or tenderness, no rebound or guarding Extremities: no edema or skin discoloration or tenderness  Pelvic: Vulva: Normal             Vagina: No gross lesions or discharge  Cervix/Uterus absent  Adnexa  Without masses or tenderness  Anus: Normal   Assessment/Plan:  64 y.o. female for annual exam   1. Well female exam with routine gynecological exam Gynecologic exam status post total hysterectomy.  No indication for Pap test.  Breast exam normal.  Screening mammogram October 2020.  Health labs with family physician.  Body mass index 20.5.  Continue with fitness and healthy nutrition.  2. S/P total hysterectomy  3. Postmenopausal hormone replacement therapy On the hormone replacement therapy for 10 years.  Recommend stopping hormone replacement therapy.  Patient agrees to start weaning HRT, will decrease to estradiol patch 0.075 twice a week.  Prescription sent to pharmacy.  4. Osteopenia of multiple sites We will repeat bone density here.  Calcium intake of 1500 mg daily, vitamin D supplements, regular weightbearing physical activities. - DG Bone Density; Future  Other orders - estradiol (VIVELLE-DOT) 0.075 MG/24HR; Place 1 patch onto the skin 2 (two) times a  week.  Genia Del MD, 3:13 PM 12/22/2019

## 2019-12-24 ENCOUNTER — Encounter: Payer: Self-pay | Admitting: Obstetrics & Gynecology

## 2019-12-29 ENCOUNTER — Encounter: Payer: Managed Care, Other (non HMO) | Admitting: Obstetrics & Gynecology

## 2020-01-19 ENCOUNTER — Other Ambulatory Visit: Payer: Self-pay

## 2020-01-19 ENCOUNTER — Ambulatory Visit
Admission: RE | Admit: 2020-01-19 | Discharge: 2020-01-19 | Disposition: A | Discharge status: Home / Self Care | Payer: Managed Care, Other (non HMO) | Source: Ambulatory Visit | Attending: Obstetrics & Gynecology | Admitting: Obstetrics & Gynecology

## 2020-01-19 DIAGNOSIS — Z1231 Encounter for screening mammogram for malignant neoplasm of breast: Secondary | ICD-10-CM

## 2020-02-03 ENCOUNTER — Other Ambulatory Visit: Payer: Self-pay | Admitting: Gastroenterology

## 2020-03-02 ENCOUNTER — Ambulatory Visit (INDEPENDENT_AMBULATORY_CARE_PROVIDER_SITE_OTHER): Payer: Managed Care, Other (non HMO)

## 2020-03-02 ENCOUNTER — Other Ambulatory Visit: Payer: Self-pay | Admitting: Obstetrics & Gynecology

## 2020-03-02 ENCOUNTER — Other Ambulatory Visit: Payer: Self-pay

## 2020-03-02 DIAGNOSIS — M8589 Other specified disorders of bone density and structure, multiple sites: Secondary | ICD-10-CM

## 2020-03-02 DIAGNOSIS — Z780 Asymptomatic menopausal state: Secondary | ICD-10-CM

## 2020-03-11 DIAGNOSIS — G629 Polyneuropathy, unspecified: Secondary | ICD-10-CM | POA: Insufficient documentation

## 2020-03-11 DIAGNOSIS — G4700 Insomnia, unspecified: Secondary | ICD-10-CM | POA: Insufficient documentation

## 2020-03-11 DIAGNOSIS — Z23 Encounter for immunization: Secondary | ICD-10-CM | POA: Insufficient documentation

## 2020-11-17 NOTE — Progress Notes (Signed)
HPI female former smoker followed for COPD, rhinitis, complicated by GERD/Barrett's Her brother [name] had been followed here for chronic respiratory failure with congestive heart failure and history of pulmonary emboli and bullous emphysema. She thought he had an alpha-1 antitrypsin abnormality  a1AT 02/19/13- was normal MM PFT 02/18/13-minimal obstructive airways disease, no response to bronchodilator, FVC 4.13/114%, FEV1 2.32/82%, ratio 0.56, FEF 25-75% 1.05/41%, DLCO 70%, TLC 118% Office Spirometry 08/13/2017-mild obstruction.  FVC 3.67/105%, FEV1 2.12/79%, ratio 0.58, FEF 25-75% 0.98/41% -----------------------------------------------------------------------------------------   10/14/19- 65 year old female former smoker followed for COPD, rhinitis, complicated by GERD/Barrett's Atrovent 0.06% nasal,  Easier DOE, esp carrying weight in arms, stairs etc.Little wheeze or cough.  Had 2 Moderna Covax Nasal drainage controlled fairly well with Atrovent spray.Discussed trial of Nasalcrom. We reviewed CXR images and discussed emphysema.  CXR 10/14/2018- IMPRESSION: Emphysema and pulmonary hyperinflation. No acute abnormality of the lungs.  11/18/20- 65 year old female former smoker (42pkyrs), followed for COPD, rhinitis, complicated by GERD/Barrett's, ADHD,  Atrovent 0.06% nasal,  Covid vax-    3 Moderna                        Works for [employer] Dept -----Has a little cough. Breztri made no difference- not using. Occasional minor intermittent dry cough. Stable DOE.  Aware of GERD but controlled. CXR 10/14/19- FINDINGS: Cardiac shadow is within normal limits. The lungs are hyperinflated consistent with COPD. No focal infiltrate or sizable effusion is noted. IMPRESSION: No active cardiopulmonary disease.   ROS-see HPI + = positive Constitutional:   No-   weight loss, night sweats, fevers, chills, fatigue, lassitude. HEENT:   No-  headaches, difficulty swallowing, tooth/dental  problems, sore throat,       No-  sneezing, itching, ear ache,  nasal congestion, post nasal drip,  CV:  No-   chest pain, orthopnea, PND, swelling in lower extremities, anasarca, dizziness, palpitations Resp: +Per HPI- shortness of breath with exertion or at rest.              No-   productive cough,   +non-productive cough,  No- coughing up of blood.              No-   change in color of mucus.   wheezing.   Skin: No-   rash or lesions. GI:  + heartburn, indigestion, No-abdominal pain, nausea, vomiting,  GU:  MS:  No-   joint pain or swelling.   Neuro-     nothing unusual Psych:  No- change in mood or affect. No depression or anxiety.  No memory loss.  OBJ General- Alert, Oriented, Affect-appropriate, Distress- none acute; well-appearing Skin- rash-none, lesions- none, excoriation- none Lymphadenopathy- none Head- atraumatic            Eyes- Gross vision intact, PERRLA, conjunctivae clear secretions            Ears- Hearing, canals-normal            Nose-clear, no-Septal dev,  polyps,                                                             erosion, perforation             Throat- Mallampati II , mucosa clear , drainage- none, tonsils- atrophic  Neck- flexible , trachea midline, no stridor , thyroid nl, carotid no bruit Chest - symmetrical excursion , unlabored           Heart/CV- RRR , no murmur , no gallop  , no rub, nl s1 s2                           - JVD- none , edema- none, stasis changes- none, varices- none           Lung- clear to P&A/ distant, wheeze- none, cough- none , dullness-none, rub- none           Chest wall-  Abd-  Br/ Gen/ Rectal- Not done, not indicated Extrem- cyanosis- none, clubbing, none, atrophy- none, strength- nl Neuro- grossly intact to observation

## 2020-11-18 ENCOUNTER — Ambulatory Visit (INDEPENDENT_AMBULATORY_CARE_PROVIDER_SITE_OTHER): Payer: Managed Care, Other (non HMO)

## 2020-11-18 ENCOUNTER — Encounter: Payer: Self-pay | Admitting: Internal Medicine

## 2020-11-18 ENCOUNTER — Other Ambulatory Visit: Payer: Self-pay

## 2020-11-18 ENCOUNTER — Ambulatory Visit: Payer: Managed Care, Other (non HMO) | Admitting: Internal Medicine

## 2020-11-18 VITALS — BP 132/70 | HR 73 | Temp 98.1°F | Ht 66.0 in | Wt 132.6 lb

## 2020-11-18 DIAGNOSIS — J449 Chronic obstructive pulmonary disease, unspecified: Secondary | ICD-10-CM

## 2020-11-18 DIAGNOSIS — K22719 Barrett's esophagus with dysplasia, unspecified: Secondary | ICD-10-CM

## 2020-11-18 MED ORDER — IPRATROPIUM BROMIDE 0.06 % NA SOLN: NASAL | 4 refills | Status: DC

## 2020-11-18 NOTE — Patient Instructions (Signed)
Atrovent refilled  Breztri inhaler taken off your med list since it wasn't helpful  Order- CXR   dx COPD mixed type  Please cal as needed

## 2020-11-19 NOTE — Progress Notes (Signed)
Called and left pt detailed msg on machine with results ok per [facility]

## 2020-11-21 ENCOUNTER — Encounter: Payer: Self-pay | Admitting: Internal Medicine

## 2020-11-21 NOTE — Assessment & Plan Note (Signed)
Reminder reflux precautions 

## 2020-11-21 NOTE — Assessment & Plan Note (Addendum)
Clinically stable. Mainly emphysema, with no response likely from inhalers unless she gets a bronchitis flare as discussed. Watch cough- possibly reflects GERD. Plan- CXR

## 2020-12-13 ENCOUNTER — Other Ambulatory Visit: Payer: Self-pay | Admitting: Obstetrics & Gynecology

## 2020-12-13 DIAGNOSIS — Z1231 Encounter for screening mammogram for malignant neoplasm of breast: Secondary | ICD-10-CM

## 2020-12-22 ENCOUNTER — Ambulatory Visit (INDEPENDENT_AMBULATORY_CARE_PROVIDER_SITE_OTHER): Payer: Managed Care, Other (non HMO) | Admitting: Obstetrics & Gynecology

## 2020-12-22 ENCOUNTER — Other Ambulatory Visit (HOSPITAL_COMMUNITY)
Admission: RE | Admit: 2020-12-22 | Discharge: 2020-12-22 | Disposition: A | Discharge status: Home / Self Care | Payer: Managed Care, Other (non HMO) | Source: Ambulatory Visit | Attending: Obstetrics & Gynecology | Admitting: Obstetrics & Gynecology

## 2020-12-22 ENCOUNTER — Encounter: Payer: Self-pay | Admitting: Obstetrics & Gynecology

## 2020-12-22 ENCOUNTER — Other Ambulatory Visit: Payer: Self-pay

## 2020-12-22 VITALS — BP 110/70 | HR 78 | Temp 98.7°F | Resp 16 | Ht 65.75 in | Wt 130.0 lb

## 2020-12-22 DIAGNOSIS — Z01419 Encounter for gynecological examination (general) (routine) without abnormal findings: Secondary | ICD-10-CM | POA: Insufficient documentation

## 2020-12-22 DIAGNOSIS — Z1272 Encounter for screening for malignant neoplasm of vagina: Secondary | ICD-10-CM | POA: Insufficient documentation

## 2020-12-22 DIAGNOSIS — R319 Hematuria, unspecified: Secondary | ICD-10-CM

## 2020-12-22 DIAGNOSIS — R87620 Atypical squamous cells of undetermined significance on cytologic smear of vagina (ASC-US): Secondary | ICD-10-CM | POA: Insufficient documentation

## 2020-12-22 DIAGNOSIS — Z90710 Acquired absence of both cervix and uterus: Secondary | ICD-10-CM | POA: Diagnosis not present

## 2020-12-22 DIAGNOSIS — Z79890 Hormone replacement therapy (postmenopausal): Secondary | ICD-10-CM | POA: Diagnosis not present

## 2020-12-22 DIAGNOSIS — M8589 Other specified disorders of bone density and structure, multiple sites: Secondary | ICD-10-CM

## 2020-12-22 MED ORDER — ESTRADIOL 0.05 MG/24HR TD PTTW: 1.0000 | MEDICATED_PATCH | TRANSDERMAL | 4 refills | Status: DC

## 2020-12-22 NOTE — Progress Notes (Signed)
Cheyenne Gallegos 11/21/1955 [id number]   History:    65 y.o. G1P1L1 Married   RP:  Established patient presenting for annual gyn exam    HPI: S/P total hysterectomy more than 20 years ago.  Postmenopause, well on estradiol patch 0.075 twice a week.  On hormone replacement therapy since 2011.  No pelvic pain.  No pain with intercourse. ASCUS x 2, no HR HPV.  Colpo 12/2018 No dysplasia. Cystitis treated with MacroBID 2 weeks ago.  Had mild blood in urine.  Breasts normal.  Body mass index good at 21.14.  Good fitness and healthy nutrition.  Health labs with Fam MD.  Past medical history,surgical history, family history and social history were all reviewed and documented in the EPIC chart.  Gynecologic History No LMP recorded. Patient has had a hysterectomy.  Obstetric History OB History  Gravida Para Term Preterm AB Living  1 1 1     1   SAB IAB Ectopic Multiple Live Births          1    # Outcome Date GA Lbr Len/2nd Weight Sex Delivery Anes PTL Lv  1 Term     M Vag-Spont  N LIV     ROS: A ROS was performed and pertinent positives and negatives are included in the history.  GENERAL: No fevers or chills. HEENT: No change in vision, no earache, sore throat or sinus congestion. NECK: No pain or stiffness. CARDIOVASCULAR: No chest pain or pressure. No palpitations. PULMONARY: No shortness of breath, cough or wheeze. GASTROINTESTINAL: No abdominal pain, nausea, vomiting or diarrhea, melena or bright red blood per rectum. GENITOURINARY: No urinary frequency, urgency, hesitancy or dysuria. MUSCULOSKELETAL: No joint or muscle pain, no back pain, no recent trauma. DERMATOLOGIC: No rash, no itching, no lesions. ENDOCRINE: No polyuria, polydipsia, no heat or cold intolerance. No recent change in weight. HEMATOLOGICAL: No anemia or easy bruising or bleeding. NEUROLOGIC: No headache, seizures, numbness, tingling or weakness. PSYCHIATRIC: No depression, no loss of interest in normal activity or  change in sleep pattern.     Exam:   BP 110/70   Pulse 78   Temp 98.7 F (37.1 C) (Oral)   Resp 16   Ht 5' 5.75" (1.67 m)   Wt 130 lb (59 kg)   BMI 21.14 kg/m   Body mass index is 21.14 kg/m.  General appearance : Well developed well nourished female. No acute distress HEENT: Eyes: no retinal hemorrhage or exudates,  Neck supple, trachea midline, no carotid bruits, no thyroidmegaly Lungs: Clear to auscultation, no rhonchi or wheezes, or rib retractions  Heart: Regular rate and rhythm, no murmurs or gallops Breast:Examined in sitting and supine position were symmetrical in appearance, no palpable masses or tenderness,  no skin retraction, no nipple inversion, no nipple discharge, no skin discoloration, no axillary or supraclavicular lymphadenopathy Abdomen: no palpable masses or tenderness, no rebound or guarding Extremities: no edema or skin discoloration or tenderness  Pelvic: Vulva: Normal             Vagina: No gross lesions or discharge.  Pap reflex done.  Cervix/Uterus absent  Adnexa  Without masses or tenderness  Anus: Normal  U/A: Yellow clear, nitrite negative, proteins negative, white blood cells 0-5, red blood cells negative, bacteria few.  Urine culture pending.   Assessment/Plan:  65 y.o. female for annual exam   1. Encounter for Papanicolaou smear of vagina as part of routine gynecological examination Gynecologic exam status post total hysterectomy.  History  of ASCUS in 2020, but high risk HPV negative and colpo negative.  History of VIN 3.  Decision to proceed with a Pap test this year.  Breast exam normal.  Screening mammogram scheduled.  Colonoscopy 2018.  Health labs with family physician.  Good body mass index at 21.14.  Continue with fitness and healthy nutrition. - Cytology - PAP( [place])  2. S/P total hysterectomy  3. Atypical squamous cell changes of undetermined significance (ASCUS) on vaginal cytology - Cytology - PAP( [place])  4.  Postmenopausal hormone replacement therapy On hormone replacement therapy since 2011.  Risks of hormone replacement therapy at this point including stroke and breast cancer discussed with patient.  Recommendation to stop hormone replacement therapy made, but patient chooses to slowly wean.  Estradiol patch 0.05 twice a week prescribed.  5. Osteopenia of multiple sites Bone density showing osteopenia December 2021.  Will repeat at 2 years.  Continue with vitamin D supplements, calcium intake of 1.5 g/day and regular weightbearing physical activities.  6. Hematuria, unspecified type Patient reassured no blood on urine analysis.  If has a recurrence, will refer to urology.  Urine culture pending. - Urinalysis,Complete w/RFL Culture  Other orders - zolpidem (AMBIEN) 5 MG tablet; Take 5 mg by mouth at bedtime as needed. - Urine Culture - REFLEXIVE URINE CULTURE - estradiol (VIVELLE-DOT) 0.05 MG/24HR patch; Place 1 patch (0.05 mg total) onto the skin 2 (two) times a week.   Genia Del MD, 8:56 AM 12/22/2020

## 2020-12-23 LAB — CYTOLOGY - PAP: Diagnosis: NEGATIVE

## 2020-12-24 LAB — URINALYSIS, COMPLETE W/RFL CULTURE
Bilirubin Urine: NEGATIVE
Casts: NONE SEEN /LPF
Crystals: NONE SEEN /HPF
Glucose, UA: NEGATIVE
Hgb urine dipstick: NEGATIVE
Hyaline Cast: NONE SEEN /LPF
Ketones, ur: NEGATIVE
Leukocyte Esterase: NEGATIVE
Nitrites, Initial: NEGATIVE
Protein, ur: NEGATIVE
RBC / HPF: NONE SEEN /HPF (ref 0–2)
Specific Gravity, Urine: 1.01 (ref 1.001–1.035)
Yeast: NONE SEEN /HPF
pH: 5.5 (ref 5.0–8.0)

## 2020-12-24 LAB — URINE CULTURE
MICRO NUMBER:: 12433612
SPECIMEN QUALITY:: ADEQUATE

## 2020-12-24 LAB — CULTURE INDICATED

## 2021-01-19 ENCOUNTER — Ambulatory Visit
Admission: RE | Admit: 2021-01-19 | Discharge: 2021-01-19 | Disposition: A | Discharge status: Home / Self Care | Payer: Managed Care, Other (non HMO) | Source: Ambulatory Visit | Attending: Obstetrics & Gynecology | Admitting: Obstetrics & Gynecology

## 2021-01-19 ENCOUNTER — Other Ambulatory Visit: Payer: Self-pay

## 2021-01-19 DIAGNOSIS — Z1231 Encounter for screening mammogram for malignant neoplasm of breast: Secondary | ICD-10-CM

## 2021-02-02 ENCOUNTER — Other Ambulatory Visit: Payer: Self-pay | Admitting: Gastroenterology

## 2021-03-04 ENCOUNTER — Other Ambulatory Visit: Payer: Self-pay | Admitting: Gastroenterology

## 2021-06-09 ENCOUNTER — Telehealth: Payer: Self-pay | Admitting: Gastroenterology

## 2021-06-09 NOTE — Telephone Encounter (Signed)
Good Morning Dr. Chales Abrahams,  ? ? ?Patient called this morning wanting to schedule her EGD. Patient has a recall in for 10/23. Patient is asking if there is anyway she can be seem before then due to insurance reasons. Can you advise on scheduling please? ? ?Thank you.  ?

## 2021-06-09 NOTE — Telephone Encounter (Signed)
Absolutely. ?Proceed with EGD directly at [facility] (RE: Barrett's esophagus)-first available with me ?RG ?

## 2021-06-10 NOTE — Telephone Encounter (Signed)
Pt scheduled for an EGD on 06/28/2021 at 10:00 in the [place] with Dr. Lyndel Safe: Pt made aware ?Pt scheduled for a previsit appointment on 06/14/2021 at 10:00 AM: Pt made aware ?Pt verbalized understanding with all questions answered.  ? ? ?

## 2021-06-14 ENCOUNTER — Other Ambulatory Visit: Payer: Self-pay

## 2021-06-14 ENCOUNTER — Ambulatory Visit (AMBULATORY_SURGERY_CENTER): Payer: 59 | Admitting: *Deleted

## 2021-06-14 VITALS — Ht 66.0 in | Wt 130.0 lb

## 2021-06-14 DIAGNOSIS — Z8719 Personal history of other diseases of the digestive system: Secondary | ICD-10-CM

## 2021-06-14 NOTE — Progress Notes (Signed)

## 2021-06-28 ENCOUNTER — Encounter: Payer: Self-pay | Admitting: Gastroenterology

## 2021-06-28 ENCOUNTER — Ambulatory Visit (AMBULATORY_SURGERY_CENTER): Payer: Managed Care, Other (non HMO) | Admitting: Gastroenterology

## 2021-06-28 VITALS — BP 125/65 | HR 67 | Temp 97.7°F | Resp 12 | Ht 65.0 in | Wt 130.0 lb

## 2021-06-28 DIAGNOSIS — K2270 Barrett's esophagus without dysplasia: Secondary | ICD-10-CM

## 2021-06-28 DIAGNOSIS — K219 Gastro-esophageal reflux disease without esophagitis: Secondary | ICD-10-CM | POA: Diagnosis not present

## 2021-06-28 DIAGNOSIS — K2970 Gastritis, unspecified, without bleeding: Secondary | ICD-10-CM

## 2021-06-28 DIAGNOSIS — K31A0 Gastric intestinal metaplasia, unspecified: Secondary | ICD-10-CM | POA: Diagnosis not present

## 2021-06-28 DIAGNOSIS — Z8719 Personal history of other diseases of the digestive system: Secondary | ICD-10-CM

## 2021-06-28 MED ORDER — SODIUM CHLORIDE 0.9 % IV SOLN: 500.0000 mL | INTRAVENOUS | Status: DC

## 2021-06-28 NOTE — Addendum Note (Signed)
Addended by: Tad Moore E on: 06/28/2021 11:01 AM ? ? Modules accepted: Orders ? ?

## 2021-06-28 NOTE — Op Note (Signed)
[facility] ?Patient Name: Cheyenne Gallegos ?Procedure Date: 06/28/2021 10:10 AM ?MRN: [id number] ?Endoscopist: Lynann Bolognaajesh Trust Crago , MD ?Age: 66 ?Referring MD:  ?Date of Birth: April 29, 1955 ?Gender: Female ?Account #: [id number] ?Procedure:                Upper GI endoscopy ?Indications:              Follow-up of prev Dx short segment Barrett's  ?                          esophagus ?Medicines:                Monitored Anesthesia Care ?Procedure:                Pre-Anesthesia Assessment: ?                          - Prior to the procedure, a History and Physical  ?                          was performed, and patient medications and  ?                          allergies were reviewed. The patient's tolerance of  ?                          previous anesthesia was also reviewed. The risks  ?                          and benefits of the procedure and the sedation  ?                          options and risks were discussed with the patient.  ?                          All questions were answered, and informed consent  ?                          was obtained. Prior Anticoagulants: The patient has  ?                          taken no previous anticoagulant or antiplatelet  ?                          agents. ASA Grade Assessment: II - A patient with  ?                          mild systemic disease. After reviewing the risks  ?                          and benefits, the patient was deemed in  ?                          satisfactory condition to undergo the procedure. ?  After obtaining informed consent, the endoscope was  ?                          passed under direct vision. Throughout the  ?                          procedure, the patient's blood pressure, pulse, and  ?                          oxygen saturations were monitored continuously. The  ?                          Endoscope was introduced through the mouth, and  ?                          advanced to the second part of duodenum. The upper   ?                          GI endoscopy was accomplished without difficulty.  ?                          The patient tolerated the procedure well. ?Scope In: ?Scope Out: ?Findings:                 The Z-line was irregular and was found 38 cm from  ?                          the incisors with few islands of salmon-colored  ?                          mucosa, examined by NBI. A small transient hiatal  ?                          hernia was noted best visualized on the full  ?                          distention of lower esophagus. Biopsies were taken  ?                          with a cold forceps for histology. ?                          The entire examined stomach was normal. ?                          The examined duodenum was normal. ?Complications:            No immediate complications. ?Estimated Blood Loss:     Estimated blood loss: none. ?Impression:               - Z-line irregular, 38 cm from the incisors.  ?                          Biopsied. ?                          -  Small transient hiatal hernia. ?Recommendation:           - Patient has a contact number available for  ?                          emergencies. The signs and symptoms of potential  ?                          delayed complications were discussed with the  ?                          patient. Return to normal activities tomorrow.  ?                          Written discharge instructions were provided to the  ?                          patient. ?                          - Resume previous diet. ?                          - Continue present medications including Prevacid  ?                          30 mg p.o. once a day. ?                          - Await pathology results. ?                          - The findings and recommendations were discussed  ?                          with the patient's family. ?Lynann Bologna, MD ?06/28/2021 10:28:30 AM ?This report has been signed electronically. ?

## 2021-06-28 NOTE — Progress Notes (Signed)
? ? ?Chief Complaint:  ? ?Referring Provider:  Hadley Penobbins, Robert A, MD    ? ? ?ASSESSMENT AND PLAN;  ? ?#1. GERD with short segment Barrett's esophagus  ? ?Plan: ?- Pravacid 30mg  po qd to continue for now. ?- EGD  ?HPI:   ? ?Cheyenne Gallegos is a 66 y.o. female  ?For follow-up visit ?For medication refill ?No nausea, vomiting, heartburn, regurgitation, odynophagia or dysphagia.  No significant diarrhea or constipation.  There is no melena or hematochezia. No unintentional weight loss. ? ?-Colonoscopy 12/2016: Except for small internal hemorrhoids.  Repeat in 10 years.  Earlier, if with any new problems. ?Past Medical History:  ?Diagnosis Date  ? ADD (attention deficit disorder with hyperactivity)   ? Allergy   ? seasonal  ? Anemia   ? PAST HX   ? 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 Barretts esophagus   ? Condyloma   ? vulvar  ? COPD (chronic obstructive pulmonary disease) (HCC)   ? mild   ? Emphysema of lung (HCC)   ? GERD (gastroesophageal reflux disease)   ? Hypercholesteremia   ? Insomnia   ? Nausea   ? Normal spontaneous vaginal delivery   ? Osteopenia   ? Tachycardia   ? VIN I (vulvar intraepithelial neoplasia I)   ? left labia  ? VIN III (vulvar intraepithelial neoplasia III)   ? Vitamin D deficiency   ? ? ?Past Surgical History:  ?Procedure Laterality Date  ? BREAST BIOPSY  [id number],83,80,86  ? BREAST EXCISIONAL BIOPSY Right 1979  ? BREAST EXCISIONAL BIOPSY Right 1980  ? BREAST EXCISIONAL BIOPSY Right 1983  ? BREAST EXCISIONAL BIOPSY Right 1986  ? CARDIOVASCULAR STRESS TEST  12/17/2009  ? Images showed normal pattern of perfusion in all regions. EKG is negative for ischemia.  ? co2 laser of labia    ? COLONOSCOPY  01/05/2017  ? Small internal hemorrhoids. Otherwise normal colonoscopy.   ? ESOPHAGOGASTRODUODENOSCOPY  08/20/2015  ? Barretts esophagus. Gastric poylp.  ? FOOT SURGERY  2001  ? HAND SURGERY Right Apr 28 2014  ? TOTAL VAGINAL HYSTERECTOMY  1995  ? TUBAL LIGATION    ? UPPER GASTROINTESTINAL ENDOSCOPY    ? VULVA SURGERY     ? ? ?Family History  ?Problem Relation Age of Onset  ? Hypertension Mother   ? Diabetes Mother   ? Heart disease Mother   ? Chronic Renal Failure Mother   ? Arrhythmia Mother   ?     Atrial Fibrillation  ? Colon cancer Paternal Grandmother   ? Cancer Paternal Grandmother   ?     Colon cancer  ? Heart failure Maternal Grandmother   ? Heart attack Maternal Grandfather   ? COPD Brother   ? Lung disease Brother   ? Breast cancer Neg Hx   ? Colon polyps Neg Hx   ? Esophageal cancer Neg Hx   ? Rectal cancer Neg Hx   ? Stomach cancer Neg Hx   ? ? ?Social History  ? ?Tobacco Use  ? Smoking status: Former  ?  Packs/day: 1.50  ?  Years: 28.00  ?  Pack years: 42.00  ?  Types: Cigarettes  ?  Quit date: 02/05/2000  ?  Years since quitting: 21.4  ?  Passive exposure: Past ("husband smoked")  ? Smokeless tobacco: Never  ?Vaping Use  ? Vaping Use: Never used  ?Substance Use Topics  ? Alcohol use: Yes  ?  Comment: occasionally-mixed drinl  ? Drug use: No  ? ? ?  Current Outpatient Medications  ?Medication Sig Dispense Refill  ? AZO-CRANBERRY PO Take by mouth 2 (two) times daily.    ? calcium carbonate (OS-CAL) 600 MG TABS Take 600 mg by mouth 2 (two) times daily with a meal.    ? Cetirizine HCl (ZYRTEC PO) Take 1 tablet by mouth daily.     ? cholecalciferol (VITAMIN D3) 25 MCG (1000 UT) tablet Take 2,000 Units by mouth daily.    ? estradiol (VIVELLE-DOT) 0.05 MG/24HR patch Place 1 patch (0.05 mg total) onto the skin 2 (two) times a week. 24 patch 4  ? ipratropium (ATROVENT) 0.06 % nasal spray Use 2 sprays in each nostril four times daily as needed 45 mL 4  ? lansoprazole (PREVACID) 30 MG capsule Take 1 capsule (30 mg total) by mouth daily in the afternoon. Call [phone] to schedule an office visit for more refills. No more refills 30 capsule 3  ? lisdexamfetamine (VYVANSE) 70 MG capsule Take 70 mg by mouth daily.    ? Magnesium 400 MG CAPS Take 800 mg by mouth daily.    ? Melatonin 10 MG CAPS Take by mouth at bedtime.    ?  Probiotic Product (PROBIOTIC DAILY PO) Take 1 tablet by mouth daily.    ? rOPINIRole (REQUIP) 1 MG tablet Take 1 mg by mouth at bedtime.    ? vitamin C (ASCORBIC ACID) 500 MG tablet Take 2,000 mg by mouth daily.    ? vitamin E 400 UNIT capsule Take 400 Units by mouth daily.    ? zolpidem (AMBIEN) 5 MG tablet Take 5 mg by mouth at bedtime as needed.    ? Multiple Vitamins-Minerals (PRESERVISION AREDS PO) Take 1 tablet by mouth 2 (two) times daily.    ? valACYclovir (VALTREX) 1000 MG tablet as needed.    ? ?Current Facility-Administered Medications  ?Medication Dose Route Frequency Provider Last Rate Last Admin  ? 0.9 %  sodium chloride infusion  500 mL Intravenous Continuous Lynann Bologna, MD      ? ? ?Allergies  ?Allergen Reactions  ? Morphine Nausea And Vomiting  ? ? ?Review of Systems:  ?Negative except for HPI ? ?  ? ?Physical Exam:   ? ?BP 127/76   Pulse 63   Temp 97.7 ?F (36.5 ?C) (Temporal)   Ht 5\' 5"  (1.651 m)   Wt 130 lb (59 kg)   SpO2 100%   BMI 21.63 kg/m?  ?Filed Weights  ? 06/28/21 0919  ?Weight: 130 lb (59 kg)  ? ?Constitutional:  Well-developed, in no acute distress. ?Psychiatric: Normal mood and affect. Behavior is normal. ?HEENT: Pupils normal.  Conjunctivae are normal. No scleral icterus.  ?Pulmonary/chest: Effort normal and breath sounds normal. No wheezing, rales or rhonchi. ?Abdominal: Soft, nondistended. Nontender. Bowel sounds active throughout. There are no masses palpable. No hepatomegaly. ? ?Data Reviewed: I have personally reviewed following labs and imaging studies ? ?CBC: ? ?  Latest Ref Rng & Units 12/12/2018  ?  9:12 AM 02/12/2018  ?  9:01 AM 12/22/2016  ? 11:49 AM  ?CBC  ?WBC 3.8 - 10.8 Thousand/uL 4.2   4.6   4.5    ?Hemoglobin 11.7 - 15.5 g/dL 12/24/2016   09.3   26.7    ?Hematocrit 35.0 - 45.0 % 37.4   37.9   37.1    ?Platelets 140 - 400 Thousand/uL 292   280   273    ? ? ?CMP: ? ?  Latest Ref Rng & Units 12/12/2018  ?  9:12 AM 02/12/2018  ?  9:01 AM 12/22/2016  ? 11:49 AM  ?CMP   ?Glucose 65 - 99 mg/dL 88   77   88    ?BUN 7 - 25 mg/dL 20   19   17     ?Creatinine 0.50 - 0.99 mg/dL   7.32   2.02    ?Sodium 135 - 146 mmol/L 139   139   140    ?Potassium 3.5 - 5.3 mmol/L 4.1   4.2   4.0    ?Chloride 98 - 110 mmol/L 107   104   106    ?CO2 20 - 32 mmol/L 29   27   28     ?Calcium 8.6 - 10.4 mg/dL 9.1   9.2   9.0    ?Total Protein 6.1 - 8.1 g/dL 6.0   6.1   6.1    ?Total Bilirubin 0.2 - 1.2 mg/dL 0.4   0.4   0.5    ?AST 10 - 35 U/L 21   23   20     ?ALT 6 - 29 U/L 19   20   19     ? ? ? ?Radiology Studies: ?No results found. ? ? ? ? ?5.42, MD 06/28/2021, 10:07 AM ? ?Cc: , MD ? ? ?

## 2021-06-28 NOTE — Progress Notes (Signed)
To Pacu, VSS. Report to [email or web address] 

## 2021-06-28 NOTE — Patient Instructions (Signed)
Thank you for letting us take care of your healthcare needs today.    YOU HAD AN ENDOSCOPIC PROCEDURE TODAY AT THE [facility]:   Refer to the procedure report that was given to you for any specific questions about what was found during the examination.  If the procedure report does not answer your questions, please call your gastroenterologist to clarify.  If you requested that your care partner not be given the details of your procedure findings, then the procedure report has been included in a sealed envelope for you to review at your convenience later.  YOU SHOULD EXPECT: Some feelings of bloating in the abdomen. Passage of more gas than usual.  Walking can help get rid of the air that was put into your GI tract during the procedure and reduce the bloating. If you had a lower endoscopy (such as a colonoscopy or flexible sigmoidoscopy) you may notice spotting of blood in your stool or on the toilet paper. If you underwent a bowel prep for your procedure, you may not have a normal bowel movement for a few days.  Please Note:  You might notice some irritation and congestion in your nose or some drainage.  This is from the oxygen used during your procedure.  There is no need for concern and it should clear up in a day or so.  SYMPTOMS TO REPORT IMMEDIATELY:    Following upper endoscopy (EGD)  Vomiting of blood or coffee ground material  New chest pain or pain under the shoulder blades  Painful or persistently difficult swallowing  New shortness of breath  Fever of 100F or higher  Black, tarry-looking stools  For urgent or emergent issues, a gastroenterologist can be reached at any hour by calling [phone]. Do not use MyChart messaging for urgent concerns.    DIET:  We do recommend a small meal at first, but then you may proceed to your regular diet.  Drink plenty of fluids but you should avoid alcoholic beverages for 24 hours.  ACTIVITY:  You should plan to take it  easy for the rest of today and you should NOT DRIVE or use heavy machinery until tomorrow (because of the sedation medicines used during the test).    FOLLOW UP: Our staff will call the number listed on your records 48-72 hours following your procedure to check on you and address any questions or concerns that you may have regarding the information given to you following your procedure. If we do not reach you, we will leave a message.  We will attempt to reach you two times.  During this call, we will ask if you have developed any symptoms of COVID 19. If you develop any symptoms (ie: fever, flu-like symptoms, shortness of breath, cough etc.) before then, please call [phone].  If you test positive for Covid 19 in the 2 weeks post procedure, please call and report this information to us.    If any biopsies were taken you will be contacted by phone or by letter within the next 1-3 weeks.  Please call us at [phone] if you have not heard about the biopsies in 3 weeks.    SIGNATURES/CONFIDENTIALITY: You and/or your care partner have signed paperwork which will be entered into your electronic medical record.  These signatures attest to the fact that that the information above on your After Visit Summary has been reviewed and is understood.  Full responsibility of the confidentiality of this discharge information lies with you and/or   your care-partner. 

## 2021-06-28 NOTE — Progress Notes (Signed)
Called to room to assist during endoscopic procedure.  Patient ID and intended procedure confirmed with present staff. Received instructions for my participation in the procedure from the performing physician.  

## 2021-06-28 NOTE — Progress Notes (Signed)
Pt's states no medical or surgical changes since previsit or office visit. 

## 2021-06-28 NOTE — Addendum Note (Signed)
Addended by: Waynard Edwards T on: 06/28/2021 10:25 AM ? ? Modules accepted: Orders ? ?

## 2021-06-29 ENCOUNTER — Other Ambulatory Visit: Payer: Self-pay | Admitting: Gastroenterology

## 2021-06-30 ENCOUNTER — Other Ambulatory Visit: Payer: Self-pay | Admitting: Gastroenterology

## 2021-06-30 ENCOUNTER — Telehealth: Payer: Self-pay | Admitting: Gastroenterology

## 2021-06-30 ENCOUNTER — Telehealth: Payer: Self-pay

## 2021-06-30 NOTE — Telephone Encounter (Signed)
?  Follow up Call- ? ? ?  06/28/2021  ?  9:20 AM 12/24/2018  ?  7:55 AM  ?Call back number  ?Post procedure Call Back phone  # [phone]  ?Permission to leave phone message Yes Yes  ?  ? ?Patient questions: ? ?Do you have a fever, pain , or abdominal swelling? No. ?Pain Score  0 * ? ?Have you tolerated food without any problems? Yes.   ? ?Have you been able to return to your normal activities? Yes.   ? ?Do you have any questions about your discharge instructions: ?Diet   No. ?Medications  No. ?Follow up visit  No. ? ?Do you have questions or concerns about your Care? No. ? ?Actions: ?* If pain score is 4 or above: ?No action needed, pain <4. ? ? ?

## 2021-06-30 NOTE — Telephone Encounter (Signed)
Done

## 2021-07-03 ENCOUNTER — Encounter: Payer: Self-pay | Admitting: Gastroenterology

## 2021-10-21 ENCOUNTER — Other Ambulatory Visit: Payer: Self-pay | Admitting: Obstetrics & Gynecology

## 2021-10-21 DIAGNOSIS — Z1231 Encounter for screening mammogram for malignant neoplasm of breast: Secondary | ICD-10-CM

## 2021-12-22 ENCOUNTER — Encounter: Payer: Self-pay | Admitting: Obstetrics & Gynecology

## 2021-12-22 ENCOUNTER — Ambulatory Visit (INDEPENDENT_AMBULATORY_CARE_PROVIDER_SITE_OTHER): Payer: BC Managed Care – PPO | Admitting: Obstetrics & Gynecology

## 2021-12-22 VITALS — BP 118/78 | HR 69 | Ht 65.75 in | Wt 134.0 lb

## 2021-12-22 DIAGNOSIS — Z90710 Acquired absence of both cervix and uterus: Secondary | ICD-10-CM | POA: Diagnosis not present

## 2021-12-22 DIAGNOSIS — M8589 Other specified disorders of bone density and structure, multiple sites: Secondary | ICD-10-CM

## 2021-12-22 DIAGNOSIS — Z01419 Encounter for gynecological examination (general) (routine) without abnormal findings: Secondary | ICD-10-CM | POA: Diagnosis not present

## 2021-12-22 DIAGNOSIS — Z79890 Hormone replacement therapy (postmenopausal): Secondary | ICD-10-CM

## 2021-12-22 MED ORDER — ESTRADIOL 0.05 MG/24HR TD PTTW: 1.0000 | MEDICATED_PATCH | TRANSDERMAL | 4 refills | Status: DC

## 2021-12-22 NOTE — Progress Notes (Signed)
Cheyenne Gallegos 1955/07/01 [id number]   History:    66 y.o. G1P1L1 Married   RP:  Established patient presenting for annual gyn exam    HPI: S/P total hysterectomy more than 20 years ago for menorrhagia, patho benign.  Postmenopause, well on estradiol patch 0.05 twice a week. On hormone replacement therapy since 2011, knows the risks/benefits and decides to continue.  No pelvic pain.  No pain with intercourse. ASCUS x 2, no HR HPV.  Colpo 12/2018 No dysplasia. Last Pap 11/2020 Negative.  Will repeat Pap at 3 years.  Breasts normal.  Mammo Neg 12/2020, scheduled next month.  Urine/BMs normal.  Colono Neg in 2018, 10 yr schedule.  BD Osteopenia Lt Fem Neck T-Score -1.3 in 02/2020.  Will repeat BD 02/2023 here.  Body mass index good at 21.79.  Good fitness and healthy nutrition.  Health labs with Fam MD.   Past medical history,surgical history, family history and social history were all reviewed and documented in the EPIC chart.  Gynecologic History No LMP recorded. Patient has had a hysterectomy.  Obstetric History OB History  Gravida Para Term Preterm AB Living  1 1 1     1   SAB IAB Ectopic Multiple Live Births          1    # Outcome Date GA Lbr Len/2nd Weight Sex Delivery Anes PTL Lv  1 Term     M Vag-Spont  N LIV     ROS: A ROS was performed and pertinent positives and negatives are included in the history. GENERAL: No fevers or chills. HEENT: No change in vision, no earache, sore throat or sinus congestion. NECK: No pain or stiffness. CARDIOVASCULAR: No chest pain or pressure. No palpitations. PULMONARY: No shortness of breath, cough or wheeze. GASTROINTESTINAL: No abdominal pain, nausea, vomiting or diarrhea, melena or bright red blood per rectum. GENITOURINARY: No urinary frequency, urgency, hesitancy or dysuria. MUSCULOSKELETAL: No joint or muscle pain, no back pain, no recent trauma. DERMATOLOGIC: No rash, no itching, no lesions. ENDOCRINE: No polyuria, polydipsia, no heat or  cold intolerance. No recent change in weight. HEMATOLOGICAL: No anemia or easy bruising or bleeding. NEUROLOGIC: No headache, seizures, numbness, tingling or weakness. PSYCHIATRIC: No depression, no loss of interest in normal activity or change in sleep pattern.     Exam:   BP 118/78   Pulse 69   Ht 5' 5.75" (1.67 m)   Wt 134 lb (60.8 kg)   SpO2 96%   BMI 21.79 kg/m   Body mass index is 21.79 kg/m.  General appearance : Well developed well nourished female. No acute distress HEENT: Eyes: no retinal hemorrhage or exudates,  Neck supple, trachea midline, no carotid bruits, no thyroidmegaly Lungs: Clear to auscultation, no rhonchi or wheezes, or rib retractions  Heart: Regular rate and rhythm, no murmurs or gallops Breast:Examined in sitting and supine position were symmetrical in appearance, no palpable masses or tenderness,  no skin retraction, no nipple inversion, no nipple discharge, no skin discoloration, no axillary or supraclavicular lymphadenopathy Abdomen: no palpable masses or tenderness, no rebound or guarding Extremities: no edema or skin discoloration or tenderness  Pelvic: Vulva: Normal             Vagina: No gross lesions or discharge  Cervix/Uterus absent  Adnexa  Without masses or tenderness  Anus: Normal   Assessment/Plan:  66 y.o. female for annual exam   1. Well female exam with routine gynecological exam S/P total hysterectomy more than  20 years ago for menorrhagia, patho benign.  Postmenopause, well on estradiol patch 0.05 twice a week. On hormone replacement therapy since 2011, knows the risks/benefits and decides to continue.  No pelvic pain.  No pain with intercourse. ASCUS x 2, no HR HPV.  Colpo 12/2018 No dysplasia. Last Pap 11/2020 Negative.  Will repeat Pap at 3 years.  Breasts normal.  Mammo Neg 12/2020, scheduled next month.  Urine/BMs normal.  Colono Neg in 2018, 10 yr schedule.  BD Osteopenia Lt Fem Neck T-Score -1.3 in 02/2020.  Will repeat BD 02/2023  here.  Body mass index good at 21.79.  Good fitness and healthy nutrition.  Health labs with Fam MD.  2. S/P total hysterectomy  3. Postmenopausal hormone replacement therapy S/P total hysterectomy more than 20 years ago for menorrhagia, patho benign.  Postmenopause, well on estradiol patch 0.05 twice a week. On hormone replacement therapy since 2011, knows the risks/benefits and decides to continue.  No pelvic pain.  No pain with intercourse.  Prescription of Estradiol patch sent to pharmacy.  4. Osteopenia of multiple sites BD Osteopenia Lt Fem Neck T-Score -1.3 in 02/2020.  Will repeat BD 02/2023 here.  Body mass index good at 21.79.  Good fitness and healthy nutrition.  Taking Vit D and Ca++ and on Estradiol HRT.  Other orders - VYVANSE 40 MG capsule; Take 40 mg by mouth daily. - estradiol (VIVELLE-DOT) 0.05 MG/24HR patch; Place 1 patch (0.05 mg total) onto the skin 2 (two) times a week.   Princess Bruins MD, 9:13 AM 12/22/2021

## 2021-12-25 IMAGING — MG MM DIGITAL SCREENING BILAT W/ TOMO AND CAD
8 series · 9 of 24 positions shown · non-contrast
Comparison: Previous exam(s).

CLINICAL DATA: Screening.

EXAM:
DIGITAL SCREENING BILATERAL MAMMOGRAM WITH TOMOSYNTHESIS AND CAD
TECHNIQUE: Bilateral screening digital craniocaudal and mediolateral oblique
mammograms were obtained. Bilateral screening digital breast
tomosynthesis was performed. The images were evaluated with
computer-aided detection.

[R CC synth-2D]
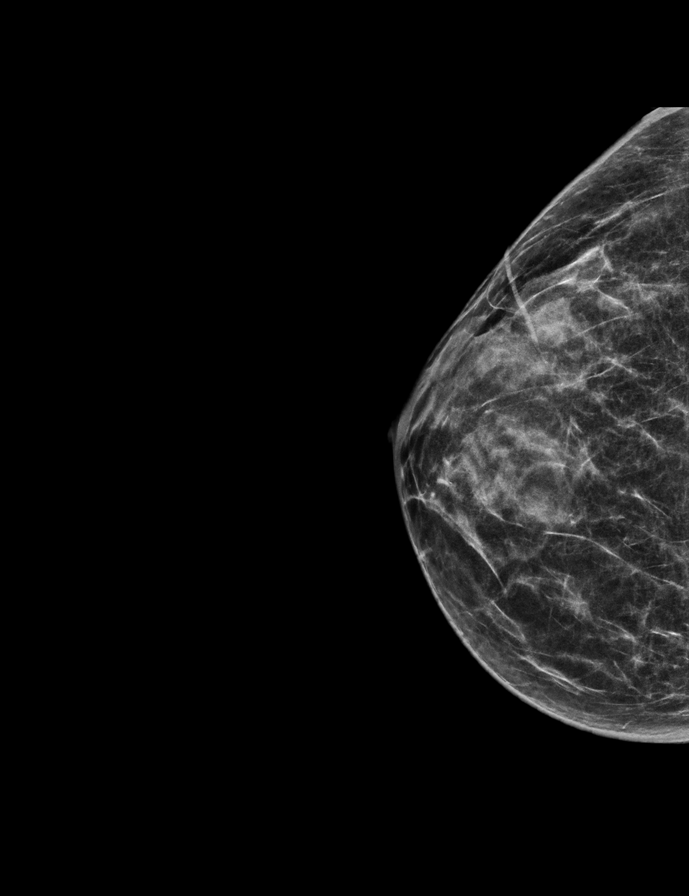

[R MLO synth-2D]
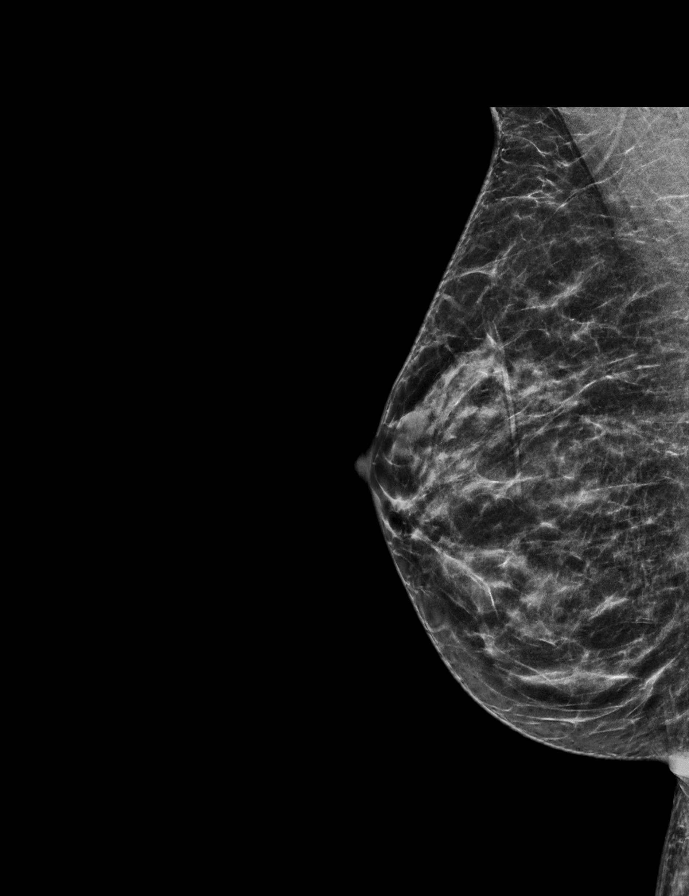

[L CC synth-2D]
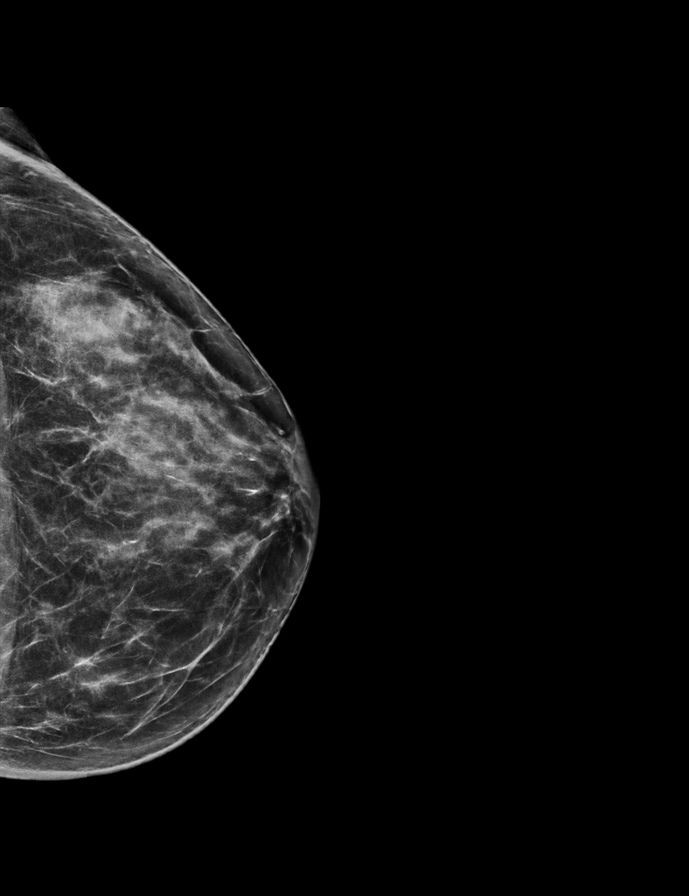

[L MLO synth-2D]
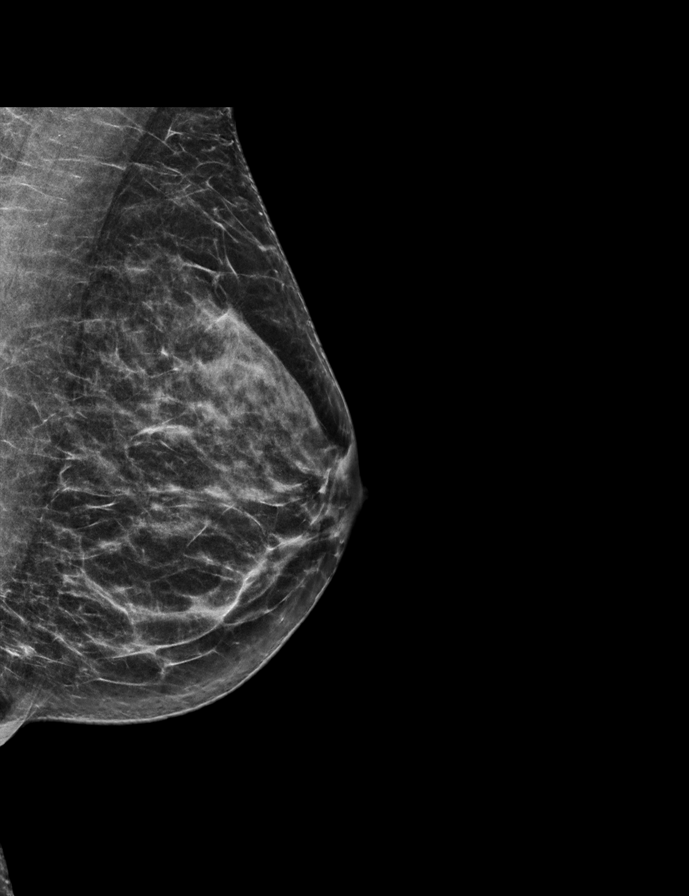

[L MLO tomo · 2 of 58 frames shown]
[frame 19/58]
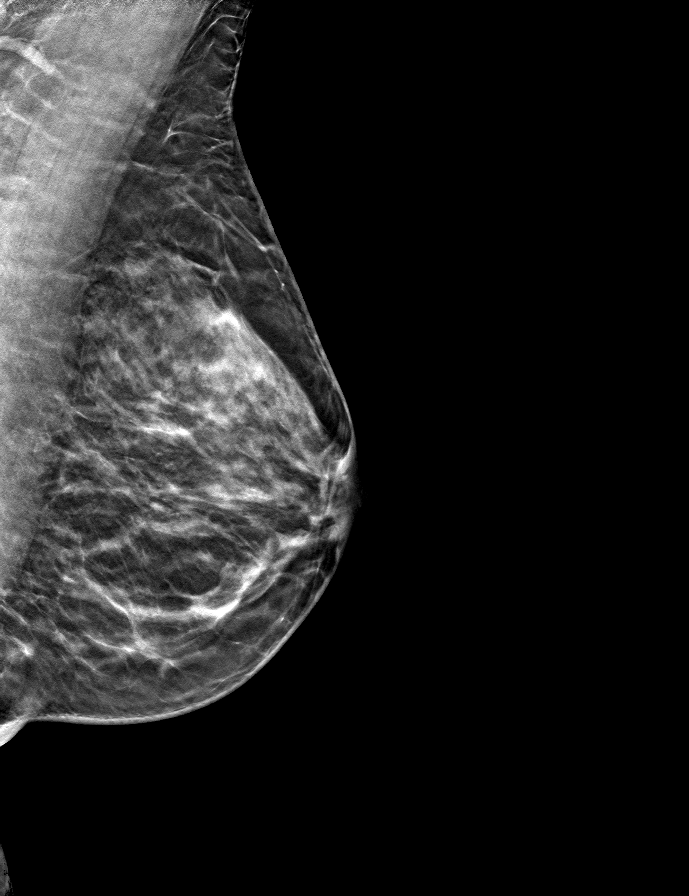
[frame 29/58]
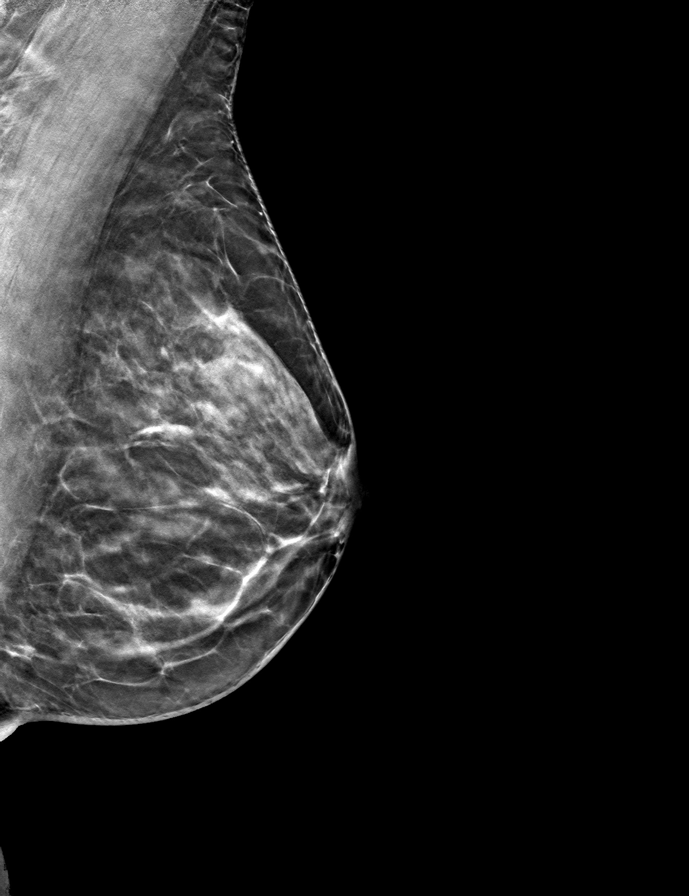

[R MLO tomo · tomo slice 25/50.0]
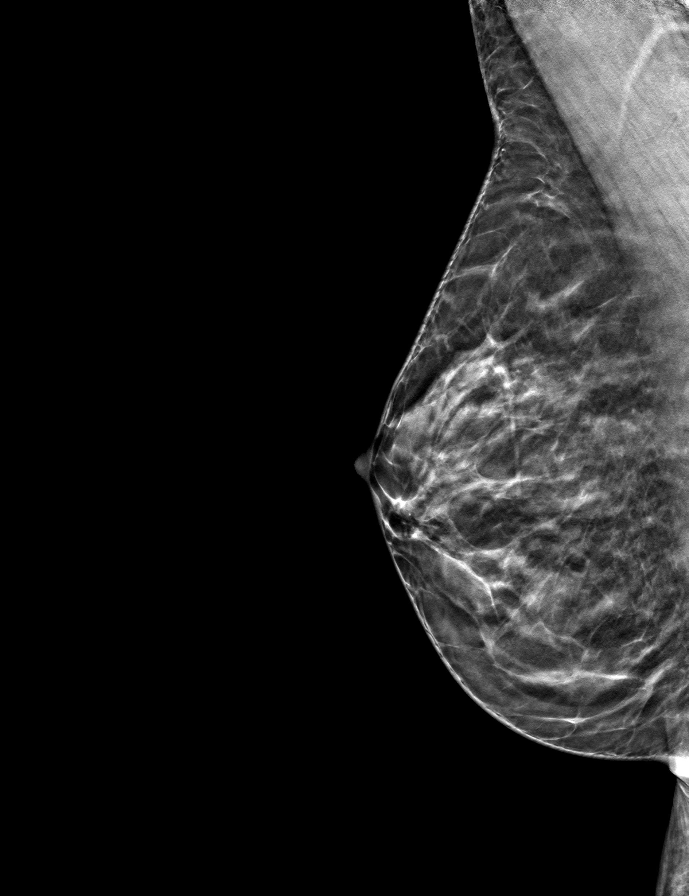

[R CC tomo · tomo slice 27/54.0]
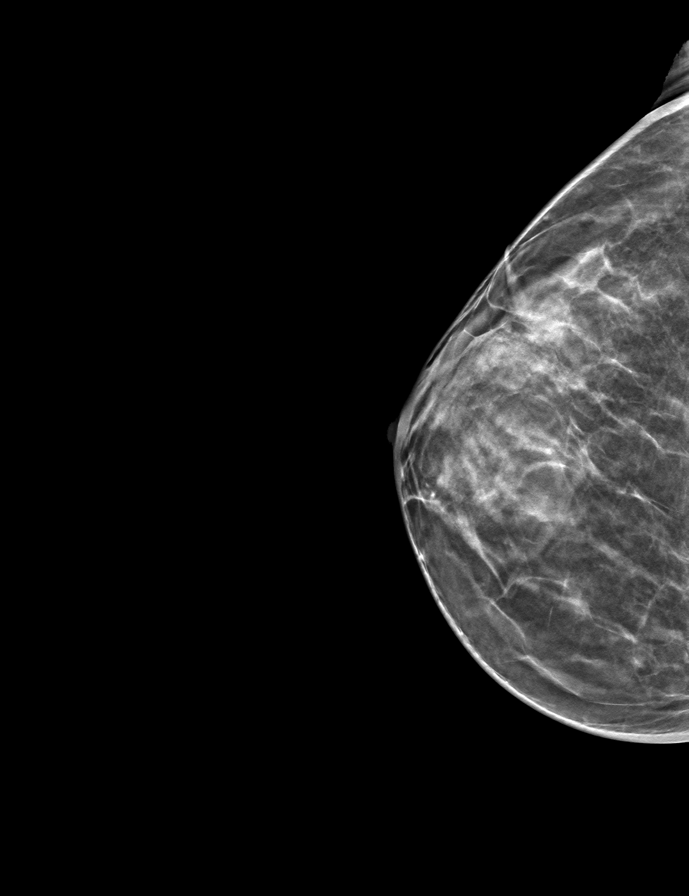

[L CC tomo · tomo slice 31/61.0]
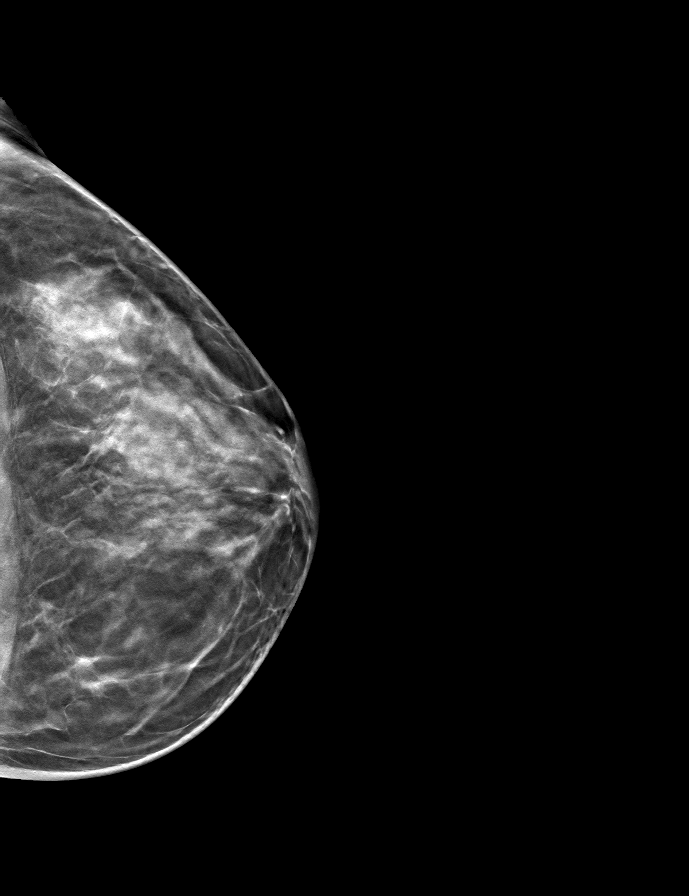

[9 of 24 positions shown; findings below may reference images not displayed]

ACR Breast Density Category c: The breast tissue is heterogeneously
dense, which may obscure small masses.
FINDINGS: There are no findings suspicious for malignancy.
IMPRESSION: No mammographic evidence of malignancy. A result letter of this
screening mammogram will be mailed directly to the patient.

RECOMMENDATION:
Screening mammogram in one year. (Code:Q3-W-BC3)

BI-RADS CATEGORY  1: Negative.

## 2022-01-20 ENCOUNTER — Ambulatory Visit: Payer: Managed Care, Other (non HMO)

## 2022-02-01 ENCOUNTER — Other Ambulatory Visit: Payer: Self-pay | Admitting: Gastroenterology

## 2022-02-01 NOTE — Progress Notes (Signed)
HPI female former smoker followed for COPD, rhinitis, complicated by GERD/Barrett's Her brother [name] had been followed here for chronic respiratory failure with congestive heart failure and history of pulmonary emboli and bullous emphysema. She thought he had an alpha-1 antitrypsin abnormality  a1AT 02/19/13- was normal MM PFT 02/18/13-minimal obstructive airways disease, no response to bronchodilator, FVC 4.13/114%, FEV1 2.32/82%, ratio 0.56, FEF 25-75% 1.05/41%, DLCO 70%, TLC 118% Office Spirometry 08/13/2017-mild obstruction.  FVC 3.67/105%, FEV1 2.12/79%, ratio 0.58, FEF 25-75% 0.98/41% -----------------------------------------------------------------------------------------   11/18/20- 66 year old female former smoker (42pkyrs), followed for COPD, rhinitis, complicated by GERD/Barrett's, ADHD,  Atrovent 0.06% nasal,  Covid vax-    3 Moderna                        Works for [employer] -----Has a little cough. Breztri made no difference- not using. Occasional minor intermittent dry cough. Stable DOE.  Aware of GERD but controlled. CXR 10/14/19- FINDINGS: Cardiac shadow is within normal limits. The lungs are hyperinflated consistent with COPD. No focal infiltrate or sizable effusion is noted. IMPRESSION: No active cardiopulmonary disease.  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  She has not been acutely ill or had significant exacerbation.  Has not felt need for a chest inhaler.  Continues Atrovent regularly trying to control postnasal drip and we discussed trying Astelin instead.  We will update CXR because  of her smoking history. CXR 11/19/20-  IMPRESSION: 1. No acute cardiopulmonary disease. 2. Stable hyperinflation and evidence for emphysematous changes.    ROS-see HPI + = positive Constitutional:   No-   weight loss, night sweats, fevers, chills, fatigue, lassitude. HEENT:   No-  headaches, difficulty swallowing, tooth/dental problems, sore throat,       No-  sneezing, itching, ear ache,  nasal congestion, post nasal drip,  CV:  No-   chest pain, orthopnea, PND, swelling in lower extremities, anasarca, dizziness, palpitations Resp: +Per HPI- shortness of breath with exertion or at rest.              No-   productive cough,   +non-productive cough,  No- coughing up of blood.              No-   change in color of mucus.   wheezing.   Skin: No-   rash or lesions. GI:  + heartburn, indigestion, No-abdominal pain, nausea, vomiting,  GU:  MS:  No-   joint pain or swelling.   Neuro-     nothing unusual Psych:  No- change in mood or affect. No depression or anxiety.  No memory loss.  OBJ General- Alert, Oriented, Affect-appropriate, Distress- none acute; well-appearing Skin- rash-none, lesions- none, excoriation- none Lymphadenopathy- none Head- atraumatic            Eyes- Gross vision intact, PERRLA, conjunctivae clear secretions            Ears- Hearing, canals-normal            Nose-clear, no-Septal dev,  polyps- none,  erosion, perforation  Throat- Mallampati II , mucosa clear , drainage- none, tonsils- atrophic Neck- flexible , trachea midline, no stridor , thyroid nl, carotid no bruit Chest - symmetrical excursion , unlabored           Heart/CV- RRR , no murmur , no gallop  , no rub, nl s1 s2                           - JVD- none , edema- none, stasis changes- none, varices- none           Lung- clear to P&A/ distant, wheeze- none, cough- none , dullness-none, rub- none           Chest wall-  Abd-  Br/ Gen/ Rectal- Not done, not indicated Extrem- cyanosis- none,  clubbing, none, atrophy- none, strength- nl Neuro- grossly intact to observation

## 2022-02-02 ENCOUNTER — Ambulatory Visit (INDEPENDENT_AMBULATORY_CARE_PROVIDER_SITE_OTHER): Payer: BC Managed Care – PPO

## 2022-02-02 ENCOUNTER — Encounter: Payer: Self-pay | Admitting: Internal Medicine

## 2022-02-02 ENCOUNTER — Ambulatory Visit: Payer: BC Managed Care – PPO | Admitting: Internal Medicine

## 2022-02-02 VITALS — BP 138/84 | HR 78 | Ht 66.5 in | Wt 135.8 lb

## 2022-02-02 DIAGNOSIS — J310 Chronic rhinitis: Secondary | ICD-10-CM

## 2022-02-02 DIAGNOSIS — J449 Chronic obstructive pulmonary disease, unspecified: Secondary | ICD-10-CM

## 2022-02-02 MED ORDER — AZELASTINE HCL 0.1 % NA SOLN: NASAL | 12 refills | Status: DC

## 2022-02-02 NOTE — Patient Instructions (Signed)
Script sent to try Astelin nasal spray instead of Atrovent. See if it works as well.  Order CXR dx COPD mixed type

## 2022-02-03 NOTE — Assessment & Plan Note (Signed)
Atrovent is not making much difference with her postnasal drip. Plan-try Astelin nasal spray

## 2022-02-03 NOTE — Assessment & Plan Note (Signed)
Minimal obstruction on PFTs no routine cough or wheeze.  He has not needed a bronchodilator.  Because of smoking history we will update CXR. Plan-CXR

## 2022-02-06 ENCOUNTER — Telehealth: Payer: Self-pay | Admitting: Gastroenterology

## 2022-02-06 NOTE — Telephone Encounter (Signed)
Patient called states she needs refill on her Prevacid 30 mg capsule. Patient states she called in her refill last week and the pharmacy states she's out of refill. Please call to advise.

## 2022-02-06 NOTE — Telephone Encounter (Signed)
Patient called to follow up on refill request states the pharmacy tried to reach since Thursday.

## 2022-02-07 ENCOUNTER — Other Ambulatory Visit: Payer: Self-pay | Admitting: Internal Medicine

## 2022-02-07 MED ORDER — IPRATROPIUM BROMIDE 0.06 % NA SOLN: 2.0000 | Freq: Four times a day (QID) | NASAL | 12 refills | Status: DC

## 2022-02-07 MED ORDER — LANSOPRAZOLE 30 MG PO CPDR: DELAYED_RELEASE_CAPSULE | ORAL | 1 refills | Status: DC

## 2022-02-07 NOTE — Telephone Encounter (Signed)
Done to [facility] pharmacy

## 2022-02-13 ENCOUNTER — Telehealth: Payer: Self-pay | Admitting: Internal Medicine

## 2022-02-13 MED ORDER — IPRATROPIUM BROMIDE 0.06 % NA SOLN: 2.0000 | Freq: Four times a day (QID) | NASAL | 12 refills | Status: DC

## 2022-02-13 NOTE — Telephone Encounter (Signed)
Called and spoke to patient and verified that she is needing her nasal spray sent into [facility]. Verified medication. Nothing further needed

## 2022-05-03 ENCOUNTER — Ambulatory Visit
Admission: RE | Admit: 2022-05-03 | Discharge: 2022-05-03 | Disposition: A | Discharge status: Home / Self Care | Payer: BC Managed Care – PPO | Source: Ambulatory Visit | Attending: Obstetrics & Gynecology | Admitting: Obstetrics & Gynecology

## 2022-05-03 DIAGNOSIS — Z1231 Encounter for screening mammogram for malignant neoplasm of breast: Secondary | ICD-10-CM

## 2022-06-10 ENCOUNTER — Emergency Department (HOSPITAL_COMMUNITY): Payer: BC Managed Care – PPO

## 2022-06-10 ENCOUNTER — Other Ambulatory Visit: Payer: Self-pay

## 2022-06-10 ENCOUNTER — Encounter (HOSPITAL_COMMUNITY): Payer: Self-pay | Admitting: *Deleted

## 2022-06-10 ENCOUNTER — Emergency Department (HOSPITAL_COMMUNITY)
Admission: EM | Admit: 2022-06-10 | Discharge: 2022-06-10 | Disposition: A | Discharge status: Home / Self Care | Payer: BC Managed Care – PPO | Attending: Emergency Medicine | Admitting: Emergency Medicine

## 2022-06-10 DIAGNOSIS — R42 Dizziness and giddiness: Secondary | ICD-10-CM

## 2022-06-10 DIAGNOSIS — R5383 Other fatigue: Secondary | ICD-10-CM | POA: Diagnosis present

## 2022-06-10 LAB — CBC WITH DIFFERENTIAL/PLATELET
Abs Immature Granulocytes: 0.01 10*3/uL (ref 0.00–0.07)
Basophils Absolute: 0 10*3/uL (ref 0.0–0.1)
Basophils Relative: 1 %
Eosinophils Absolute: 0.2 10*3/uL (ref 0.0–0.5)
Eosinophils Relative: 3 %
HCT: 40.2 % (ref 36.0–46.0)
Hemoglobin: 13.2 g/dL (ref 12.0–15.0)
Immature Granulocytes: 0 %
Lymphocytes Relative: 24 %
Lymphs Abs: 1.4 10*3/uL (ref 0.7–4.0)
MCH: 31.5 pg (ref 26.0–34.0)
MCHC: 32.8 g/dL (ref 30.0–36.0)
MCV: 95.9 fL (ref 80.0–100.0)
Monocytes Absolute: 0.4 10*3/uL (ref 0.1–1.0)
Monocytes Relative: 7 %
Neutro Abs: 3.7 10*3/uL (ref 1.7–7.7)
Neutrophils Relative %: 65 %
Platelets: 274 10*3/uL (ref 150–400)
RBC: 4.19 MIL/uL (ref 3.87–5.11)
RDW: 12.5 % (ref 11.5–15.5)
WBC: 5.6 10*3/uL (ref 4.0–10.5)
nRBC: 0 % (ref 0.0–0.2)

## 2022-06-10 LAB — BASIC METABOLIC PANEL
Anion gap: 7 (ref 5–15)
BUN: 20 mg/dL (ref 8–23)
CO2: 26 mmol/L (ref 22–32)
Calcium: 8.9 mg/dL (ref 8.9–10.3)
Chloride: 105 mmol/L (ref 98–111)
Creatinine, Ser: 0.87 mg/dL (ref 0.44–1.00)
GFR, Estimated: >60 mL/min (ref >60)
Glucose, Bld: 102 mg/dL — ABNORMAL HIGH (ref 70–99)
Potassium: 4.4 mmol/L (ref 3.5–5.1)
Sodium: 138 mmol/L (ref 135–145)

## 2022-06-10 LAB — CBG MONITORING, ED: Glucose-Capillary: 85 mg/dL (ref 70–99)

## 2022-06-10 NOTE — ED Provider Notes (Signed)
[facility] Provider Note   CSN: [id number] Arrival date & time: 06/10/22  1718     History  Chief Complaint  Patient presents with   Fatigue    Cheyenne Gallegos is a 67 y.o. female.  HPI Patient presents with her husband who assists with the history.  She notes that over the past week she has had fatigue, dyspnea, particular with activity.  Patient had a fall 6 days ago.  Prior to that she was in her usual state of health.  Patient is a smoker, though she quit 2 decades ago.  She also has a history of Mnire's disease.  1 week ago the patient had episode of feeling suddenly unsteady, fell striking left side of her head, left chest, and since that time she has had pain in her head, left chest, and fatigue.  No vision changes, no focal weakness in any extremities.  After discussing her episode with colleagues at work, Engineer, civil (consulting) suggested evaluation, and that has not yet been completed, but with persistent symptoms she presents for evaluation.    Home Medications Prior to Admission medications   Medication Sig Start Date End Date Taking? Authorizing Provider  azelastine (ASTELIN) 0.1 % nasal spray 1 puff each nostril up to 3 times daily as needed 02/02/22   Baird Lyons D, MD  AZO-CRANBERRY PO Take by mouth 2 (two) times daily.    [provider]  calcium carbonate (OS-CAL) 600 MG TABS Take 600 mg by mouth 2 (two) times daily with a meal.    [provider]  Cetirizine HCl (ZYRTEC PO) Take 1 tablet by mouth daily.     [provider]  cholecalciferol (VITAMIN D3) 25 MCG (1000 UT) tablet Take 1,000 Units by mouth daily.    [provider]  estradiol (VIVELLE-DOT) 0.05 MG/24HR patch Place 1 patch (0.05 mg total) onto the skin 2 (two) times a week. 12/22/21   Princess Bruins, MD  ipratropium (ATROVENT) 0.06 % nasal spray Place 2 sprays into both nostrils 4 (four) times daily. 02/13/22   Baird Lyons  D, MD  lansoprazole (PREVACID) 30 MG capsule TAKE ONE CAPSULE BY MOUTH EVERY DAY AT NOON. 02/07/22   Jackquline Denmark, MD  Magnesium 400 MG CAPS Take 800 mg by mouth daily.    [provider]  Multiple Vitamins-Minerals (PRESERVISION AREDS PO) Take 1 tablet by mouth 2 (two) times daily.    [provider]  Probiotic Product (PROBIOTIC DAILY PO) Take 1 tablet by mouth daily.    [provider]  rOPINIRole (REQUIP) 1 MG tablet Take 1 mg by mouth at bedtime.    [provider]  valACYclovir (VALTREX) 1000 MG tablet as needed. 11/30/18   [provider]  vitamin C (ASCORBIC ACID) 500 MG tablet Take 2,000 mg by mouth daily.    [provider]  vitamin E 400 UNIT capsule Take 400 Units by mouth daily.    [provider]  VYVANSE 40 MG capsule Take 40 mg by mouth daily. 12/05/21   [provider]  zolpidem (AMBIEN) 5 MG tablet Take 5 mg by mouth at bedtime as needed. 12/03/20   [provider]      Allergies    Morphine    Review of Systems   Review of Systems  All other systems reviewed and are negative.   Physical Exam Updated Vital Signs BP (!) 157/70   Pulse 62   Temp 98.2 F (36.8  C)   Resp 11   Ht 5\' 6"  (1.676 m)   Wt 61.7 kg   SpO2 97%   BMI 21.95 kg/m  Physical Exam Vitals and nursing note reviewed.  Constitutional:      General: She is not in acute distress.    Appearance: She is well-developed.  HENT:     Head: Normocephalic and atraumatic.  Eyes:     Conjunctiva/sclera: Conjunctivae normal.  Cardiovascular:     Rate and Rhythm: Normal rate and regular rhythm.  Pulmonary:     Effort: Pulmonary effort is normal. No respiratory distress.     Breath sounds: Normal breath sounds. No stridor.  Chest:    Abdominal:     General: There is no distension.  Skin:    General: Skin is warm and dry.  Neurological:     Mental Status: She is alert and oriented to person, place, and time.     Cranial  Nerves: No cranial nerve deficit.     Motor: No weakness, tremor, atrophy or abnormal muscle tone.  Psychiatric:        Mood and Affect: Mood normal.     ED Results / Procedures / Treatments   Labs (all labs ordered are listed, but only abnormal results are displayed) Labs Reviewed  BASIC METABOLIC PANEL - Abnormal; Notable for the following components:      Result Value   Glucose, Bld 102 (*)    All other components within normal limits  CBC WITH DIFFERENTIAL/PLATELET  CBG MONITORING, ED    EKG EKG Interpretation  Date/Time:  Saturday June 10 2022 18:25:11 EDT Ventricular Rate:  58 PR Interval:  152 QRS Duration: 92 QT Interval:  396 QTC Calculation: 388 R Axis:   67 Text Interpretation: Sinus bradycardia Otherwise normal ECG No previous ECGs available Confirmed by Carmin Muskrat [phone]) on 06/10/2022 7:31:43 PM  Radiology MR Brain Wo Contrast (neuro protocol)  Result Date: 06/10/2022 CLINICAL DATA:  Acute neurologic deficit EXAM: MRI HEAD WITHOUT CONTRAST TECHNIQUE: Multiplanar, multiecho pulse sequences of the brain and surrounding structures were obtained without intravenous contrast. COMPARISON:  None Available. FINDINGS: Brain: No acute infarct, mass effect or extra-axial collection. No acute or chronic hemorrhage. There is multifocal hyperintense T2-weighted signal within the white matter. Parenchymal volume and CSF spaces are normal. The midline structures are normal. Vascular: Major flow voids are preserved. Skull and upper cervical spine: Normal calvarium and skull base. Visualized upper cervical spine and soft tissues are normal. Sinuses/Orbits:No paranasal sinus fluid levels or advanced mucosal thickening. No mastoid or middle ear effusion. Normal orbits. IMPRESSION: 1. No acute intracranial abnormality. 2. Multifocal hyperintense T2-weighted signal within the white matter, most consistent with chronic small vessel ischemia. Electronically Signed   By: Ulyses Jarred  M.D.   On: 06/10/2022 20:29   CT HEAD WO CONTRAST  Result Date: 06/10/2022 CLINICAL DATA:  Head trauma, minor (Age >= 65y) EXAM: CT HEAD WITHOUT CONTRAST TECHNIQUE: Contiguous axial images were obtained from the base of the skull through the vertex without intravenous contrast. RADIATION DOSE REDUCTION: This exam was performed according to the departmental dose-optimization program which includes automated exposure control, adjustment of the mA and/or kV according to patient size and/or use of iterative reconstruction technique. COMPARISON:  None Available. FINDINGS: Brain: No evidence of acute infarction, hemorrhage, hydrocephalus, extra-axial collection or mass lesion/mass effect. Vascular: No hyperdense vessel or unexpected calcification. Skull: Normal. Negative for fracture or focal lesion. Sinuses/Orbits: No acute finding. Other: Mastoid air cells and middle ear  cavities are clear. IMPRESSION: 1. No acute intracranial abnormality. Electronically Signed   By: Fidela Salisbury M.D.   On: 06/10/2022 19:27    Procedures Procedures    Medications Ordered in ED Medications - No data to display  ED Course/ Medical Decision Making/ A&P                             Medical Decision Making Patient presents 1 week after episode of sudden disequilibrium, syncope with fall.  Patient has ongoing fatigue.  Here she is awake, alert, hypertensive, but otherwise hemodynamically unremarkable.  Concern for vertigo versus TIA, causing the episode as well as possible traumatic sequelae such as contusion or rib fracture.  Patient is neurologically currently unremarkable.  Head CT, labs, x-ray ordered.  Cardiac 60 sinus normal Pulse ox 100% room air normal   Amount and/or Complexity of Data Reviewed Independent Historian: spouse Labs: ordered. Decision-making details documented in ED Course. Radiology: ordered and independent interpretation performed. Decision-making details documented in ED  Course. ECG/medicine tests: ordered and independent interpretation performed. Decision-making details documented in ED Course.  Risk Prescription drug management. Decision regarding hospitalization.   9:33 PM Patient awake, alert, in no distress, speaking clearly.  No hypoxia.  No tachycardia.  She does have mild hypertension.  In discussing today's findings including CT, MRI, labs, episode that occurred 1 week ago we discussed her medical history, including notation of prior malignancy which she states was negative on biopsy.  She has negative risk profile for PE, and given that the left chest pain occurred after fall, or suspicion for that being musculoskeletal.  In addition, the patient has no tachypnea, hypoxia suggesting PE. Patient's CT, MRI both do not demonstrate mass, nor acute stroke, possible TIA or episode of Mnire's remains on the differential.  Patient will follow-up with neurologist in this regard. Notably, the patient had previously been scheduled for continuous cardiac monitoring given history of occasional palpitations, and the patient may have had an episode of A-fib.  Patient will continue plans for cardiology follow-up, continuous cardiac monitoring in this regard.  Though she does have mild hypertension, she has no evidence for organ damage, no change, there are some suspicion for this being longstanding given the MRI results. Absent acute changes, given the duration of 1 week since the the episode, patient appropriate for close outpatient follow-up, after we also discussed return precautions.         Final Clinical Impression(s) / ED Diagnoses Final diagnoses:  Dysequilibrium  Other fatigue    Rx / DC Orders ED Discharge Orders          Ordered    Ambulatory referral to Neurology       Comments: An appointment is requested in approximately: 1 week  History of Meniere's concern for possible new TIA.   06/10/22 2132              Carmin Muskrat,  MD 06/10/22 2133

## 2022-06-10 NOTE — ED Notes (Signed)
Pt to MRI at this time, A&O x4, NAD noted

## 2022-06-10 NOTE — Discharge Instructions (Signed)
As discussed, your evaluation today has been largely reassuring.  But, it is important that you monitor your condition carefully, and do not hesitate to return to the ED if you develop new, or concerning changes in your condition.  Otherwise, please follow-up with your physician for planned continuous cardiac monitoring, and with our neurologist.  The neurology office should contact you Monday for follow-up care.  If they do not please use the information provided above to ensure appropriate ongoing outpatient management.

## 2022-06-10 NOTE — ED Provider Triage Note (Signed)
Emergency Medicine Provider Triage Evaluation Note  Cheyenne Gallegos , a 67 y.o. female  was evaluated in triage.  Pt complains of generalized fatigue.  Had a syncopal episode on Sunday.  Was walking out of the kitchen and syncopized.  She fell forward and landed on her face and chest.  Was seen by a provider at her work on Thursday and encouraged to come to her primary care provider.  She went to her primary care provider yesterday and had MRI of the brain and other tests ordered.  When she was raking leaves today she felt fatigued.  Nurse at work assessed her and encouraged her to come to the ED for further evaluation.  Review of Systems  Positive: As above Negative: As above  Physical Exam  BP (!) 146/84   Pulse 62   Temp 98.2 F (36.8 C)   Resp 18   Ht 5\' 6"  (1.676 m)   Wt 61.7 kg   SpO2 99%   BMI 21.95 kg/m  Gen:   Awake, no distress   Resp:  Normal effort  MSK:   Moves extremities without difficulty  Other:    Medical Decision Making  Medically screening exam initiated at 6:17 PM.  Appropriate orders placed.  Lolly Mustache was informed that the remainder of the evaluation will be completed by another provider, this initial triage assessment does not replace that evaluation, and the importance of remaining in the ED until their evaluation is complete.     Roylene Reason, [place] 06/10/22 1818

## 2022-06-10 NOTE — ED Triage Notes (Signed)
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

## 2022-06-10 NOTE — ED Notes (Signed)
Pt return from MRI, pt ambulated to BR, steady gait noted

## 2022-06-10 NOTE — ED Notes (Signed)
Pt in CT.

## 2022-06-15 ENCOUNTER — Encounter: Payer: Self-pay | Admitting: Neurology

## 2022-06-15 ENCOUNTER — Ambulatory Visit: Payer: BC Managed Care – PPO | Admitting: Neurology

## 2022-06-15 VITALS — BP 143/78 | HR 73 | Ht 66.0 in | Wt 136.0 lb

## 2022-06-15 DIAGNOSIS — R55 Syncope and collapse: Secondary | ICD-10-CM | POA: Diagnosis not present

## 2022-06-15 NOTE — Progress Notes (Signed)
[facility]  PATIENT: Cheyenne Gallegos DOB: 01/31/1956  REQUESTING CLINICIAN: Carmin Muskrat, MD HISTORY FROM: Patient  REASON FOR VISIT: Syncope    HISTORICAL  CHIEF COMPLAINT:  Chief Complaint  Patient presents with   New Patient (Initial Visit)    Rm 12 NP internal referral for History of Meniere's concern for possible new TIA 2 weeks ago pt blacked out , reports no new events     HISTORY OF PRESENT ILLNESS:  This is a 67 year old woman past medical history of Mnire's disease, COPD, emphysema who is presenting after a syncopal episode.  Patient reports waking up in the morning of March 10, around 6 AM, went to the kitchen, drank a glass of water and suddenly broke out in sweat, she was trying to walk back and felt like she was being pulled on the left side and the next and that she knows she woke up on the floor.  There was no confusion.  She denies any tongue biting, denies any urinary incontinence.  She was able to call her husband who helped her to the bedroom.  She did contact her PCP who saw patient the next day.  She reports last week, her PCP office follow-up with her and she was still tired and therefore she was directed to the ED.  She went to the ED had a MRI brain which was negative, diagnosed with syncope/dysequilibrium and referred to neurology.  She reports having 2 syncopal episodes in her lifetime and this was related to her having abdominal pain prior to the syncopal episode.    Of note patient reports on March 9 she had a few glass of margarita, total of 3 glasses, woke up in the night to use the bathroom and possibly may have been dehydrated.   OTHER MEDICAL CONDITIONS: Meniere disease, COPD, Emphysema     REVIEW OF SYSTEMS: Full 14 system review of systems performed and negative with exception of: As noted in the HPI   ALLERGIES: Allergies  Allergen Reactions   Morphine Nausea And Vomiting    HOME MEDICATIONS: Outpatient  Medications Prior to Visit  Medication Sig Dispense Refill   AZO-CRANBERRY PO Take by mouth 2 (two) times daily.     calcium carbonate (OS-CAL) 600 MG TABS Take 600 mg by mouth 2 (two) times daily with a meal.     Cetirizine HCl (ZYRTEC PO) Take 1 tablet by mouth daily.      cholecalciferol (VITAMIN D3) 25 MCG (1000 UT) tablet Take 1,000 Units by mouth daily.     estradiol (VIVELLE-DOT) 0.05 MG/24HR patch Place 1 patch (0.05 mg total) onto the skin 2 (two) times a week. 24 patch 4   ipratropium (ATROVENT) 0.06 % nasal spray Place 2 sprays into both nostrils 4 (four) times daily. 15 mL 12   lansoprazole (PREVACID) 30 MG capsule TAKE ONE CAPSULE BY MOUTH EVERY DAY AT NOON. 90 capsule 1   Magnesium 400 MG CAPS Take 800 mg by mouth daily.     Multiple Vitamins-Minerals (PRESERVISION AREDS PO) Take 1 tablet by mouth 2 (two) times daily.     Probiotic Product (PROBIOTIC DAILY PO) Take 1 tablet by mouth daily.     rOPINIRole (REQUIP) 1 MG tablet Take 1 mg by mouth at bedtime.     valACYclovir (VALTREX) 1000 MG tablet as needed.     vitamin C (ASCORBIC ACID) 500 MG tablet Take 2,000 mg by mouth daily.     vitamin E 400 UNIT capsule Take 400  Units by mouth daily.     zolpidem (AMBIEN) 5 MG tablet Take 5 mg by mouth at bedtime as needed.     azelastine (ASTELIN) 0.1 % nasal spray 1 puff each nostril up to 3 times daily as needed 30 mL 12   VYVANSE 40 MG capsule Take 40 mg by mouth daily.     No facility-administered medications prior to visit.    PAST MEDICAL HISTORY: Past Medical History:  Diagnosis Date   ADD (attention deficit disorder with hyperactivity)    Allergy    seasonal   Anemia    PAST HX    Arthritis    thumbs   Barretts esophagus    Condyloma    vulvar   COPD (chronic obstructive pulmonary disease) (HCC)    mild    Emphysema of lung (HCC)    GERD (gastroesophageal reflux disease)    Hypercholesteremia    Insomnia    Nausea    Normal spontaneous vaginal delivery     Osteopenia    Tachycardia    VIN I (vulvar intraepithelial neoplasia I)    left labia   VIN III (vulvar intraepithelial neoplasia III)    Vitamin D deficiency     PAST SURGICAL HISTORY: Past Surgical History:  Procedure Laterality Date   BREAST BIOPSY  [phone]   BREAST EXCISIONAL BIOPSY Right 1979   BREAST EXCISIONAL BIOPSY Right 1980   BREAST EXCISIONAL BIOPSY Right 1983   BREAST EXCISIONAL BIOPSY Right 1986   CARDIOVASCULAR STRESS TEST  12/17/2009   Images showed normal pattern of perfusion in all regions. EKG is negative for ischemia.   co2 laser of labia     COLONOSCOPY  01/05/2017   Small internal hemorrhoids. Otherwise normal colonoscopy.    ESOPHAGOGASTRODUODENOSCOPY  08/20/2015   Barretts esophagus. Gastric poylp.   FOOT SURGERY  2001   HAND SURGERY Right Apr 28 2014   TOTAL VAGINAL HYSTERECTOMY  1995   TUBAL LIGATION     UPPER GASTROINTESTINAL ENDOSCOPY     VULVA SURGERY      FAMILY HISTORY: Family History  Problem Relation Age of Onset   Hypertension Mother    Diabetes Mother    Heart disease Mother    Chronic Renal Failure Mother    Arrhythmia Mother        Atrial Fibrillation   Colon cancer Paternal Grandmother    Cancer Paternal Grandmother        Colon cancer   Heart failure Maternal Grandmother    Heart attack Maternal Grandfather    COPD Brother    Lung disease Brother    Breast cancer Neg Hx    Colon polyps Neg Hx    Esophageal cancer Neg Hx    Rectal cancer Neg Hx    Stomach cancer Neg Hx     SOCIAL HISTORY: Social History   Socioeconomic History   Marital status: Married    Spouse name: Not on file   Number of children: Not on file   Years of education: Not on file   Highest education level: Not on file  Occupational History   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   Smoking status: Former    Packs/day: 1.50    Years: 28.00    Additional pack years: 0.00    Total pack years: 42.00    Types: Cigarettes    Quit date:  02/05/2000    Years since quitting: 22.3    Passive exposure: Past ("husband smoked")   Smokeless tobacco:  Never  Vaping Use   Vaping Use: Never used  Substance and Sexual Activity   Alcohol use: Yes    Comment: occasionally-mixed drink   Drug use: No   Sexual activity: Yes    Partners: Male    Birth control/protection: Surgical    Comment: 1st intercourse 67 yo-Fewer than 5 partners, hysterectomy  Other Topics Concern   Not on file  Social History Narrative   Not on file   Social Determinants of Health   Financial Resource Strain: Not on file  Food Insecurity: Not on file  Transportation Needs: Not on file  Physical Activity: Not on file  Stress: Not on file  Social Connections: Not on file  Intimate Partner Violence: Not on file    PHYSICAL EXAM  GENERAL EXAM/CONSTITUTIONAL: Vitals:  Vitals:   06/15/22 1329  BP: (!) 143/78  Pulse: 73  Weight: 136 lb (61.7 kg)  Height: 5\' 6"  (1.676 m)   Body mass index is 21.95 kg/m. Wt Readings from Last 3 Encounters:  06/15/22 136 lb (61.7 kg)  06/10/22 136 lb (61.7 kg)  02/02/22 135 lb 12.8 oz (61.6 kg)   Patient is in no distress; well developed, nourished and groomed; neck is supple  EYES: Visual fields full to confrontation, Extraocular movements intacts,   MUSCULOSKELETAL: Gait, strength, tone, movements noted in Neurologic exam below  NEUROLOGIC: MENTAL STATUS:      No data to display         awake, alert, oriented to person, place and time recent and remote memory intact normal attention and concentration language fluent, comprehension intact, naming intact fund of knowledge appropriate  CRANIAL NERVE:  2nd, 3rd, 4th, 6th - Visual fields full to confrontation, extraocular muscles intact, no nystagmus 5th - facial sensation symmetric 7th - facial strength symmetric 8th - hearing intact 9th - palate elevates symmetrically, uvula midline 11th - shoulder shrug symmetric 12th - tongue protrusion  midline  MOTOR:  normal bulk and tone, full strength in the BUE, BLE  SENSORY:  normal and symmetric to light touch  COORDINATION:  finger-nose-finger, fine finger movements normal  REFLEXES:  deep tendon reflexes present and symmetric  GAIT/STATION:  normal     DIAGNOSTIC DATA (LABS, IMAGING, TESTING) - I reviewed patient records, labs, notes, testing and imaging myself where available.  Lab Results  Component Value Date   WBC 5.6 06/10/2022   HGB 13.2 06/10/2022   HCT 40.2 06/10/2022   MCV 95.9 06/10/2022   PLT 274 06/10/2022      Component Value Date/Time   NA 138 06/10/2022 1817   K 4.4 06/10/2022 1817   CL 105 06/10/2022 1817   CO2 26 06/10/2022 1817   GLUCOSE 102 (H) 06/10/2022 1817   BUN 20 06/10/2022 1817   CREATININE 0.87 06/10/2022 1817   CREATININE 1.00 (H) 12/12/2018 0912   CALCIUM 8.9 06/10/2022 1817   PROT 6.0 (L) 12/12/2018 0912   ALBUMIN 3.9 12/20/2015 0941   AST 21 12/12/2018 0912   ALT 19 12/12/2018 0912   ALKPHOS 62 12/20/2015 0941   BILITOT 0.4 12/12/2018 0912   GFRNONAA >60 06/10/2022 1817   GFRAA >90 09/30/2011 1658   Lab Results  Component Value Date   CHOL 196 12/12/2018   HDL 63 12/12/2018   LDLCALC 118 (H) 12/12/2018   TRIG 62 12/12/2018   CHOLHDL 3.1 12/12/2018   No results found for: "HGBA1C" No results found for: "VITAMINB12" Lab Results  Component Value Date   TSH 2.11 12/12/2018   +  MRI Brain 06/10/2022 1. No acute intracranial abnormality. 2. Multifocal hyperintense T2-weighted signal within the white matter, most consistent with chronic small vessel ischemia    ASSESSMENT AND PLAN  67 y.o. year old female with Mnire's disease, COPD, emphysema who is presenting after a syncopal episode.  Patient reported prodrome of feeling sweaty, being pulled on the left side before passing out.  The night prior to the syncope she had a few glasses of alcohol (3 glasses or Margarita).  Her workup including head CT and MRI  negative for any acute abnormalities.  Unclear if the syncopal episode was related to her Mnire's disease but she did not have a stroke or any acute intracranial abnormality.  Patient likely had vasovagal syncope and less likely TIA.  She is currently having a cardiac monitor.  Advised her to continue follow-up with her PCP and to return if worse or any other concerns.  1. Vasovagal syncope      Patient Instructions  Continue current medications  Increase hydration  Follow up with PCP  Return as needed   No orders of the defined types were placed in this encounter.   No orders of the defined types were placed in this encounter.   Return if symptoms worsen or fail to improve.  I have spent a total of 45 minutes dedicated to this patient today, preparing to see patient, performing a medically appropriate examination and evaluation, ordering tests and/or medications and procedures, and counseling and educating the patient/family/caregiver; independently interpreting result and communicating results to the family/patient/caregiver; and documenting clinical information in the electronic medical record.   Alric Ran, MD 06/15/2022, 2:15 PM  [facility] [address], [place], [place] [address] [phone]

## 2022-06-15 NOTE — Patient Instructions (Signed)
Continue current medications  Increase hydration  Follow up with PCP  Return as needed

## 2022-08-01 ENCOUNTER — Other Ambulatory Visit: Payer: Self-pay | Admitting: Gastroenterology

## 2022-08-23 DIAGNOSIS — R1111 Vomiting without nausea: Secondary | ICD-10-CM | POA: Insufficient documentation

## 2022-08-23 DIAGNOSIS — K5900 Constipation, unspecified: Secondary | ICD-10-CM | POA: Insufficient documentation

## 2022-08-23 DIAGNOSIS — Z1211 Encounter for screening for malignant neoplasm of colon: Secondary | ICD-10-CM | POA: Insufficient documentation

## 2022-08-23 DIAGNOSIS — R079 Chest pain, unspecified: Secondary | ICD-10-CM | POA: Insufficient documentation

## 2022-08-23 DIAGNOSIS — K219 Gastro-esophageal reflux disease without esophagitis: Secondary | ICD-10-CM | POA: Insufficient documentation

## 2022-09-05 ENCOUNTER — Encounter: Payer: Self-pay | Admitting: Cardiology

## 2022-09-05 ENCOUNTER — Ambulatory Visit: Payer: BC Managed Care – PPO | Attending: Cardiology | Admitting: Cardiology

## 2022-09-05 VITALS — BP 138/82 | HR 72 | Ht 66.0 in | Wt 141.4 lb

## 2022-09-05 DIAGNOSIS — R55 Syncope and collapse: Secondary | ICD-10-CM | POA: Diagnosis not present

## 2022-09-05 DIAGNOSIS — I471 Supraventricular tachycardia, unspecified: Secondary | ICD-10-CM | POA: Diagnosis not present

## 2022-09-05 DIAGNOSIS — E7800 Pure hypercholesterolemia, unspecified: Secondary | ICD-10-CM

## 2022-09-05 DIAGNOSIS — R0602 Shortness of breath: Secondary | ICD-10-CM

## 2022-09-05 DIAGNOSIS — J449 Chronic obstructive pulmonary disease, unspecified: Secondary | ICD-10-CM | POA: Diagnosis not present

## 2022-09-05 DIAGNOSIS — F988 Other specified behavioral and emotional disorders with onset usually occurring in childhood and adolescence: Secondary | ICD-10-CM

## 2022-09-05 NOTE — Patient Instructions (Signed)
Medication Instructions:  Your physician recommends that you continue on your current medications as directed. Please refer to the Current Medication list given to you today.  *If you need a refill on your cardiac medications before your next appointment, please call your pharmacy*   Lab Work: None Ordered If you have labs (blood work) drawn today and your tests are completely normal, you will receive your results only by: MyChart Message (if you have MyChart) OR A paper copy in the mail If you have any lab test that is abnormal or we need to change your treatment, we will call you to review the results.   Testing/Procedures: Your physician has requested that you have an echocardiogram. Echocardiography is a painless test that uses sound waves to create images of your heart. It provides your doctor with information about the size and shape of your heart and how well your heart's chambers and valves are working. This procedure takes approximately one hour. There are no restrictions for this procedure. Please do NOT wear cologne, perfume, aftershave, or lotions (deodorant is allowed). Please arrive 15 minutes prior to your appointment time.    Follow-Up: At [facility], you and your health needs are our priority.  As part of our continuing mission to provide you with exceptional heart care, we have created designated Provider Care Teams.  These Care Teams include your primary Cardiologist (physician) and Advanced Practice Providers (APPs -  Physician Assistants and Nurse Practitioners) who all work together to provide you with the care you need, when you need it.  We recommend signing up for the patient portal called "MyChart".  Sign up information is provided on this After Visit Summary.  MyChart is used to connect with patients for Virtual Visits (Telemedicine).  Patients are able to view lab/test results, encounter notes, upcoming appointments, etc.  Non-urgent messages can be sent to your  provider as well.   To learn more about what you can do with MyChart, go to [email or web address].    Your next appointment:   3 month(s)  The format for your next appointment:   In Person  Provider:   Robert Krasowski, MD    Other Instructions NA  

## 2022-09-05 NOTE — Progress Notes (Signed)
Cardiology Consultation:    Date:  09/05/2022   ID:  Cheyenne Gallegos, DOB 1956/03/27, MRN [id number]  PCP:  Cheyenne Pen, MD  Cardiologist:  Gypsy Balsam, MD   Referring MD: Cheyenne Pen, MD   Chief Complaint  Patient presents with   Loss of Consciousness    History of Present Illness:    Cheyenne Gallegos is a 67 y.o. female who is being seen today for the evaluation of syncope at the request of Cheyenne Pen, MD. past medical history significant for dyslipidemia, ex smoking.  She was referred to [place] because of episodes of syncope.  Additional problem include Mnire's disease.  Last episode of syncope happened in May May 99 she did have a few drinks at evening x 3 margaritas she went to sleep and then when she woke up in the morning she wanted to go to the restroom to urinate she did that on the way back she started leaning towards the left started sweating profusely and eventually end up passing out she struck her right side of the body there was she has a lot of soreness after that.  She did not have any palpitations before that.  When she woke up she knew where she was she called her husband she describes 1 episode of syncope before which happened a long time ago when she started having bodyaches.  Since that time there is no more problems.  She did have some evaluation done before for palpitation she was found to have some APCs PVCs, stress test was done has been negative.  She is not exercising on the regular basis but thinking about doing it.  Denies have any chest pain tightness squeezing pressure burning chest some exertional shortness of breath  Past Medical History:  Diagnosis Date   ADD (attention deficit disorder with hyperactivity)    Allergy    seasonal   Anemia    PAST HX    Arthritis    thumbs   Barretts esophagus    Condyloma    vulvar   COPD (chronic obstructive pulmonary disease) (HCC)    mild    Emphysema of lung (HCC)    GERD  (gastroesophageal reflux disease)    Hypercholesteremia    Insomnia    Nausea    Normal spontaneous vaginal delivery    Osteopenia    Tachycardia    VIN I (vulvar intraepithelial neoplasia I)    left labia   VIN III (vulvar intraepithelial neoplasia III)    Vitamin D deficiency     Past Surgical History:  Procedure Laterality Date   BREAST BIOPSY  [phone]   BREAST EXCISIONAL BIOPSY Right 1979   BREAST EXCISIONAL BIOPSY Right 1980   BREAST EXCISIONAL BIOPSY Right 1983   BREAST EXCISIONAL BIOPSY Right 1986   CARDIOVASCULAR STRESS TEST  12/17/2009   Images showed normal pattern of perfusion in all regions. EKG is negative for ischemia.   co2 laser of labia     COLONOSCOPY  01/05/2017   Small internal hemorrhoids. Otherwise normal colonoscopy.    ESOPHAGOGASTRODUODENOSCOPY  08/20/2015   Barretts esophagus. Gastric poylp.   FOOT SURGERY  2001   HAND SURGERY Right Apr 28 2014   TOTAL VAGINAL HYSTERECTOMY  1995   TUBAL LIGATION     UPPER GASTROINTESTINAL ENDOSCOPY     VULVA SURGERY      Current Medications: Current Meds  Medication Sig   AZO-CRANBERRY PO Take by mouth 2 (two) times daily.  calcium carbonate (OS-CAL) 600 MG TABS Take 600 mg by mouth 2 (two) times daily with a meal.   Cetirizine HCl (ZYRTEC PO) Take 1 tablet by mouth daily.    cholecalciferol (VITAMIN D3) 25 MCG (1000 UT) tablet Take 1,000 Units by mouth daily.   estradiol (VIVELLE-DOT) 0.05 MG/24HR patch Place 1 patch (0.05 mg total) onto the skin 2 (two) times a week.   ipratropium (ATROVENT) 0.06 % nasal spray Place 2 sprays into both nostrils 4 (four) times daily.   lansoprazole (PREVACID) 30 MG capsule TAKE ONE CAPSULE BY MOUTH EVERY DAY AT NOON. Please call [phone] to schedule an office visit for more refills   Magnesium 400 MG CAPS Take 800 mg by mouth daily.   Multiple Vitamins-Minerals (PRESERVISION AREDS PO) Take 1 tablet by mouth 2 (two) times daily.   Probiotic Product (PROBIOTIC DAILY PO)  Take 1 tablet by mouth daily.   rOPINIRole (REQUIP) 1 MG tablet Take 1 mg by mouth at bedtime.   valACYclovir (VALTREX) 1000 MG tablet as needed.   vitamin C (ASCORBIC ACID) 500 MG tablet Take 2,000 mg by mouth daily.   vitamin E 400 UNIT capsule Take 400 Units by mouth daily.   zolpidem (AMBIEN) 5 MG tablet Take 5 mg by mouth at bedtime as needed.     Allergies:   Morphine   Social History   Socioeconomic History   Marital status: Married    Spouse name: Not on file   Number of children: Not on file   Years of education: Not on file   Highest education level: Not on file  Occupational History   Occupation: superivsor at [employer]  Tobacco Use   Smoking status: Former    Packs/day: 1.50    Years: 28.00    Additional pack years: 0.00    Total pack years: 42.00    Types: Cigarettes    Quit date: 02/05/2000    Years since quitting: 22.5    Passive exposure: Past ("husband smoked")   Smokeless tobacco: Never  Vaping Use   Vaping Use: Never used  Substance and Sexual Activity   Alcohol use: Yes    Comment: occasionally-mixed drink   Drug use: No   Sexual activity: Yes    Partners: Male    Birth control/protection: Surgical    Comment: 1st intercourse 67 yo-Fewer than 5 partners, hysterectomy  Other Topics Concern   Not on file  Social History Narrative   Not on file   Social Determinants of Health   Financial Resource Strain: Not on file  Food Insecurity: Not on file  Transportation Needs: Not on file  Physical Activity: Not on file  Stress: Not on file  Social Connections: Not on file     Family History: The patient's family history includes Arrhythmia in her mother; COPD in her brother; Cancer in her paternal grandmother; Chronic Renal Failure in her mother; Colon cancer in her paternal grandmother; Diabetes in her mother; Heart attack in her maternal grandfather; Heart disease in her mother; Heart failure in her maternal grandmother; Hypertension in her mother; Lung  disease in her brother. There is no history of Breast cancer, Colon polyps, Esophageal cancer, Rectal cancer, or Stomach cancer. ROS:   Please see the history of present illness.    All 14 point review of systems negative except as described per history of present illness.  EKGs/Labs/Other Studies Reviewed:    The following studies were reviewed today: I did review monitor done by primary care physician basic  rhythm sinus rhythm mean heart rate 73 minimum 53 maximum 03/28/2006.  She did have 28 episode of supraventricular tachycardia fastest episode 4 beats at rate of 203, longest episode 10 beats at rate of 110.  There were no triggered events.  She did have rare PACs, rare PVCs, overall nothing alarming.  EKG:  EKG is  ordered today.  The ekg ordered today demonstrates EKG showed normal sinus rhythm normal P interval normal QS complex duration fulgent no ST segment changes  Recent Labs: 06/10/2022: BUN 20; Creatinine, Ser 0.87; Hemoglobin 13.2; Platelets 274; Potassium 4.4; Sodium 138  Recent Lipid Panel    Component Value Date/Time   CHOL 196 12/12/2018 0912   TRIG 62 12/12/2018 0912   HDL 63 12/12/2018 0912   CHOLHDL 3.1 12/12/2018 0912   VLDL 22 12/20/2015 0941   LDLCALC 118 (H) 12/12/2018 0912    Physical Exam:    VS:  BP 138/82 (BP Location: Left Arm, Patient Position: Sitting, Cuff Size: Normal)   Pulse 72   Ht 5\' 6"  (1.676 m)   Wt 141 lb 6.4 oz (64.1 kg)   SpO2 96%   BMI 22.82 kg/m     Wt Readings from Last 3 Encounters:  09/05/22 141 lb 6.4 oz (64.1 kg)  06/15/22 136 lb (61.7 kg)  06/10/22 136 lb (61.7 kg)     GEN:  Well nourished, well developed in no acute distress HEENT: Normal NECK: No JVD; No carotid bruits LYMPHATICS: No lymphadenopathy CARDIAC: RRR, no murmurs, no rubs, no gallops RESPIRATORY:  Clear to auscultation without rales, wheezing or rhonchi  ABDOMEN: Soft, non-tender, non-distended MUSCULOSKELETAL:  No edema; No deformity  SKIN: Warm and  dry NEUROLOGIC:  Alert and oriented x 3 PSYCHIATRIC:  Normal affect   ASSESSMENT:    1. DYSPNEA   2. Syncope and collapse   3. Supraventricular tachycardia   4. COPD mixed type (HCC)   5. Hypercholesterolemia   6. Attention deficit disorder (ADD) in adult    PLAN:    In order of problems listed above:  Syncope and collapse.  She was evaluated by a neurologist already and I agree with assessment I suspect this is dehydration with orthostatic hypotension with some component of vasovagal.  She did have a few drinks before.  He was L in the morning she was probably dehydrated she also have had some prodromal symptoms with sweatiness.  She did wear monitor which did not show any arrhythmia that could explain her symptomatology.  For completion I will ask her to have an echocardiogram done make sure her structurally heart is normal.  I recommend to stay well-hydrated and drink plenty of fluids Supraventricular tachycardia, very short episodes identical need to treat that.  Will wait for results of echocardiogram to make final assessment. Dyslipidemia is a diagnosis in the chart however I do not see any recent fasting lipid profile will call primary care physician to get a copy of it.  I was able to find her cholesterol total cholesterol 207, LDL 138, HDL 61 in the future we will consider evaluation for CAD probably calcium score will be beneficial   Medication Adjustments/Labs and Tests Ordered: Current medicines are reviewed at length with the patient today.  Concerns regarding medicines are outlined above.  Orders Placed This Encounter  Procedures   ECHOCARDIOGRAM COMPLETE   No orders of the defined types were placed in this encounter.   Signed, Georgeanna Lea, MD, [facility]. 09/05/2022 4:53 PM    [facility] 

## 2022-09-07 NOTE — Addendum Note (Signed)
Addended by: Smiley Houseman B on: 09/07/2022 07:23 AM   Modules accepted: Orders

## 2022-10-04 ENCOUNTER — Telehealth: Payer: Self-pay

## 2022-10-04 ENCOUNTER — Other Ambulatory Visit: Payer: Self-pay | Admitting: Gastroenterology

## 2022-10-04 ENCOUNTER — Ambulatory Visit: Payer: Medicare HMO | Attending: Cardiology

## 2022-10-04 DIAGNOSIS — R0602 Shortness of breath: Secondary | ICD-10-CM

## 2022-10-04 LAB — ECHOCARDIOGRAM COMPLETE: S' Lateral: 2.9 cm

## 2022-10-04 NOTE — Telephone Encounter (Signed)
Left message on My Chart with normal results per Dr. Krasowski's note. Routed to PCP. 

## 2022-10-05 ENCOUNTER — Encounter: Payer: Self-pay | Admitting: Family Medicine

## 2022-10-05 ENCOUNTER — Telehealth: Payer: Self-pay

## 2022-10-05 NOTE — Telephone Encounter (Signed)
Pt viewed results in My Chart per Dr. Krasowski's note. Routed to PCP.  

## 2022-11-30 ENCOUNTER — Telehealth: Payer: Self-pay | Admitting: Gastroenterology

## 2022-11-30 MED ORDER — LANSOPRAZOLE 30 MG PO CPDR: DELAYED_RELEASE_CAPSULE | ORAL | 1 refills | Status: DC

## 2022-11-30 NOTE — Telephone Encounter (Signed)
Done

## 2022-11-30 NOTE — Telephone Encounter (Signed)
Inbound call from patient requesting refill for Prevacid. Requesting prescription to be sent to [facility] on [address] in [place]. Please advise.

## 2022-12-05 ENCOUNTER — Telehealth: Payer: Self-pay | Admitting: Internal Medicine

## 2022-12-05 NOTE — Telephone Encounter (Signed)
Patient states needs refill for Ipratropium. Pharmacy is [facility] [address] Patient phone number is [phone].

## 2022-12-06 ENCOUNTER — Encounter: Payer: Self-pay | Admitting: Cardiology

## 2022-12-06 NOTE — Telephone Encounter (Signed)
Patient not seen since 01/2022 but have sent a note to make them an appointment.

## 2022-12-07 ENCOUNTER — Encounter: Payer: Self-pay | Admitting: Cardiology

## 2022-12-07 ENCOUNTER — Ambulatory Visit: Payer: Medicare HMO | Attending: Cardiology | Admitting: Cardiology

## 2022-12-07 VITALS — BP 130/80 | HR 62 | Ht 66.0 in | Wt 143.6 lb

## 2022-12-07 DIAGNOSIS — J449 Chronic obstructive pulmonary disease, unspecified: Secondary | ICD-10-CM

## 2022-12-07 DIAGNOSIS — R0602 Shortness of breath: Secondary | ICD-10-CM

## 2022-12-07 DIAGNOSIS — I471 Supraventricular tachycardia, unspecified: Secondary | ICD-10-CM

## 2022-12-07 DIAGNOSIS — R55 Syncope and collapse: Secondary | ICD-10-CM | POA: Diagnosis not present

## 2022-12-07 DIAGNOSIS — R002 Palpitations: Secondary | ICD-10-CM

## 2022-12-07 DIAGNOSIS — E7800 Pure hypercholesterolemia, unspecified: Secondary | ICD-10-CM

## 2022-12-07 MED ORDER — IPRATROPIUM BROMIDE 0.06 % NA SOLN: 2.0000 | Freq: Four times a day (QID) | NASAL | 12 refills | Status: DC

## 2022-12-07 NOTE — Patient Instructions (Addendum)
Medication Instructions:  Your physician recommends that you continue on your current medications as directed. Please refer to the Current Medication list given to you today.  *If you need a refill on your cardiac medications before your next appointment, please call your pharmacy*   Lab Work: None Ordered If you have labs (blood work) drawn today and your tests are completely normal, you will receive your results only by: MyChart Message (if you have MyChart) OR A paper copy in the mail If you have any lab test that is abnormal or we need to change your treatment, we will call you to review the results.   Testing/Procedures: Your physician recommends that you return for lab work in: when fasting You need to have labs done when you are fasting.  You can come Monday through Friday 8:30 am to 12:00 pm and 1:15 to 4:30. You do not need to make an appointment as the order has already been placed. The labs you are going to have done are LPa, Lipids.    Follow-Up: At [facility], you and your health needs are our priority.  As part of our continuing mission to provide you with exceptional heart care, we have created designated Provider Care Teams.  These Care Teams include your primary Cardiologist (physician) and Advanced Practice Providers (APPs -  Physician Assistants and Nurse Practitioners) who all work together to provide you with the care you need, when you need it.  We recommend signing up for the patient portal called "MyChart".  Sign up information is provided on this After Visit Summary.  MyChart is used to connect with patients for Virtual Visits (Telemedicine).  Patients are able to view lab/test results, encounter notes, upcoming appointments, etc.  Non-urgent messages can be sent to your provider as well.   To learn more about what you can do with MyChart, go to [email or web address].    Your next appointment:   12 month(s)  The format for your next appointment:   In  Person  Provider:   Gypsy Balsam, MD    Other Instructions NA

## 2022-12-07 NOTE — Progress Notes (Signed)
Cardiology Office Note:    Date:  12/07/2022   ID:  Cheyenne Gallegos, DOB April 04, 1955, MRN [id number]  PCP:  Hadley Pen, MD  Cardiologist:  Gypsy Balsam, MD    Referring MD: Hadley Pen, MD   Chief Complaint  Patient presents with   Follow-up  Doing fine  History of Present Illness:    Cheyenne Gallegos is a 67 y.o. female who was referred to [place] because of episode of syncope.  From the very beginning of the episode of syncope look probably related to dehydration she drinks some alcohol before, also some vagal component.  Monitor was put on some insignificant supraventricular tachycardia has been identified.,  She also had echocardiogram which was normal.  Since that time she seems to be doing well.  Denies have any chest pain tightness squeezing pressure burning chest very active and have no difficulty doing things.  Past Medical History:  Diagnosis Date   ADD (attention deficit disorder with hyperactivity)    Allergy    seasonal   Anemia    PAST HX    Arthritis    thumbs   Barretts esophagus    Condyloma    vulvar   COPD (chronic obstructive pulmonary disease) (HCC)    mild    Emphysema of lung (HCC)    GERD (gastroesophageal reflux disease)    Hypercholesteremia    Insomnia    Nausea    Normal spontaneous vaginal delivery    Osteopenia    Tachycardia    VIN I (vulvar intraepithelial neoplasia I)    left labia   VIN III (vulvar intraepithelial neoplasia III)    Vitamin D deficiency     Past Surgical History:  Procedure Laterality Date   BREAST BIOPSY  [phone]   BREAST EXCISIONAL BIOPSY Right 1979   BREAST EXCISIONAL BIOPSY Right 1980   BREAST EXCISIONAL BIOPSY Right 1983   BREAST EXCISIONAL BIOPSY Right 1986   CARDIOVASCULAR STRESS TEST  12/17/2009   Images showed normal pattern of perfusion in all regions. EKG is negative for ischemia.   co2 laser of labia     COLONOSCOPY  01/05/2017   Small internal hemorrhoids. Otherwise normal  colonoscopy.    ESOPHAGOGASTRODUODENOSCOPY  08/20/2015   Barretts esophagus. Gastric poylp.   FOOT SURGERY  2001   HAND SURGERY Right Apr 28 2014   TOTAL VAGINAL HYSTERECTOMY  1995   TUBAL LIGATION     UPPER GASTROINTESTINAL ENDOSCOPY     VULVA SURGERY      Current Medications: Current Meds  Medication Sig   AZO-CRANBERRY PO Take 1 tablet by mouth 2 (two) times daily.   calcium carbonate (OS-CAL) 600 MG TABS Take 600 mg by mouth 2 (two) times daily with a meal.   Cetirizine HCl (ZYRTEC PO) Take 1 tablet by mouth daily.    cholecalciferol (VITAMIN D3) 25 MCG (1000 UT) tablet Take 1,000 Units by mouth daily.   estradiol (VIVELLE-DOT) 0.05 MG/24HR patch Place 1 patch (0.05 mg total) onto the skin 2 (two) times a week.   ipratropium (ATROVENT) 0.06 % nasal spray Place 2 sprays into both nostrils 4 (four) times daily.   lansoprazole (PREVACID) 30 MG capsule TAKE ONE CAPSULE BY MOUTH EVERY DAY AT NOON Please call [phone] to schedule an office visit for more refills (Patient taking differently: Take 30 mg by mouth daily at 12 noon. TAKE ONE CAPSULE BY MOUTH EVERY DAY AT NOON Please call [phone] to schedule an office visit for more refills)  Magnesium 400 MG CAPS Take 800 mg by mouth daily.   Multiple Vitamins-Minerals (PRESERVISION AREDS PO) Take 1 tablet by mouth 2 (two) times daily.   Probiotic Product (PROBIOTIC DAILY PO) Take 1 tablet by mouth daily.   rOPINIRole (REQUIP) 1 MG tablet Take 1 mg by mouth at bedtime.   valACYclovir (VALTREX) 1000 MG tablet Take 1,000 mg by mouth as needed (oUT BREAKS).   vitamin C (ASCORBIC ACID) 500 MG tablet Take 2,000 mg by mouth daily.   vitamin E 400 UNIT capsule Take 400 Units by mouth daily.   zolpidem (AMBIEN) 5 MG tablet Take 5 mg by mouth at bedtime as needed for sleep.     Allergies:   Morphine   Social History   Socioeconomic History   Marital status: Married    Spouse name: Not on file   Number of children: Not on file   Years  of education: Not on file   Highest education level: Not on file  Occupational History   Occupation: superivsor at [employer]  Tobacco Use   Smoking status: Former    Current packs/day: 0.00    Average packs/day: 1.5 packs/day for 28.0 years (42.0 ttl pk-yrs)    Types: Cigarettes    Start date: 02/05/1972    Quit date: 02/05/2000    Years since quitting: 22.8    Passive exposure: Past ("husband smoked")   Smokeless tobacco: Never  Vaping Use   Vaping status: Never Used  Substance and Sexual Activity   Alcohol use: Yes    Comment: occasionally-mixed drink   Drug use: No   Sexual activity: Yes    Partners: Male    Birth control/protection: Surgical    Comment: 1st intercourse 67 yo-Fewer than 5 partners, hysterectomy  Other Topics Concern   Not on file  Social History Narrative   Not on file   Social Determinants of Health   Financial Resource Strain: Not on file  Food Insecurity: Not on file  Transportation Needs: Not on file  Physical Activity: Not on file  Stress: Not on file  Social Connections: Not on file     Family History: The patient's family history includes Arrhythmia in her mother; COPD in her brother; Cancer in her paternal grandmother; Chronic Renal Failure in her mother; Colon cancer in her paternal grandmother; Diabetes in her mother; Heart attack in her maternal grandfather; Heart disease in her mother; Heart failure in her maternal grandmother; Hypertension in her mother; Lung disease in her brother. There is no history of Breast cancer, Colon polyps, Esophageal cancer, Rectal cancer, or Stomach cancer. ROS:   Please see the history of present illness.    All 14 point review of systems negative except as described per history of present illness  EKGs/Labs/Other Studies Reviewed:         Recent Labs: 06/10/2022: BUN 20; Creatinine, Ser 0.87; Hemoglobin 13.2; Platelets 274; Potassium 4.4; Sodium 138  Recent Lipid Panel    Component Value Date/Time   CHOL  196 12/12/2018 0912   TRIG 62 12/12/2018 0912   HDL 63 12/12/2018 0912   CHOLHDL 3.1 12/12/2018 0912   VLDL 22 12/20/2015 0941   LDLCALC 118 (H) 12/12/2018 0912    Physical Exam:    VS:  BP 130/80 (BP Location: Left Arm, Patient Position: Sitting)   Pulse 62   Ht 5\' 6"  (1.676 m)   Wt 143 lb 9.6 oz (65.1 kg)   SpO2 95%   BMI 23.18 kg/m     Wt  Readings from Last 3 Encounters:  12/07/22 143 lb 9.6 oz (65.1 kg)  09/05/22 141 lb 6.4 oz (64.1 kg)  06/15/22 136 lb (61.7 kg)     GEN:  Well nourished, well developed in no acute distress HEENT: Normal NECK: No JVD; No carotid bruits LYMPHATICS: No lymphadenopathy CARDIAC: RRR, no murmurs, no rubs, no gallops RESPIRATORY:  Clear to auscultation without rales, wheezing or rhonchi  ABDOMEN: Soft, non-tender, non-distended MUSCULOSKELETAL:  No edema; No deformity  SKIN: Warm and dry LOWER EXTREMITIES: no swelling NEUROLOGIC:  Alert and oriented x 3 PSYCHIATRIC:  Normal affect   ASSESSMENT:    1. Syncope and collapse   2. Palpitations   3. DYSPNEA   4. COPD mixed type (HCC)   5. Supraventricular tachycardia    PLAN:    In order of problems listed above:  Syncope and collapse probably vasovagal with some component of dehydration.  Will make sure that she stay well-hydrated.  I will continue monitoring. Palpitations supraventricular tachycardia but overall asymptomatic.  Will continue monitoring. Dyspnea on exertion she does have history of smoking before likely she quit years ago.  Echocardiogram showed preserved left ventricle ejection fraction. Dyslipidemia I did review K PN from last year her LDL was 138.  Will get fasting lipid profile LP(a) to decide about management of her dyslipidemia   Medication Adjustments/Labs and Tests Ordered: Current medicines are reviewed at length with the patient today.  Concerns regarding medicines are outlined above.  No orders of the defined types were placed in this  encounter.  Medication changes: No orders of the defined types were placed in this encounter.   Signed, Georgeanna Lea, MD, [facility] 12/07/2022 9:08 AM    [facility]

## 2022-12-07 NOTE — Telephone Encounter (Signed)
Scheduled patient for her annual f/u on 11/11 with Dr Maple Hudson. Nothing further needed.

## 2022-12-07 NOTE — Addendum Note (Signed)
Addended by: Baldo Ash D on: 12/07/2022 09:14 AM   Modules accepted: Orders

## 2022-12-07 NOTE — Telephone Encounter (Signed)
Ipratropium refilled

## 2022-12-27 ENCOUNTER — Telehealth: Payer: Self-pay

## 2022-12-27 DIAGNOSIS — E7800 Pure hypercholesterolemia, unspecified: Secondary | ICD-10-CM

## 2022-12-27 LAB — LIPID PANEL
Chol/HDL Ratio: 3.9 {ratio} (ref 0.0–4.4)
Cholesterol, Total: 229 mg/dL — ABNORMAL HIGH (ref 100–199)
HDL: 58 mg/dL (ref >39)
LDL Chol Calc (NIH): 159 mg/dL — ABNORMAL HIGH (ref 0–99)
Triglycerides: 71 mg/dL (ref 0–149)
VLDL Cholesterol Cal: 12 mg/dL (ref 5–40)

## 2022-12-27 LAB — LIPOPROTEIN A (LPA): Lipoprotein (a): 21.1 nmol/L (ref <75.0)

## 2022-12-27 MED ORDER — ATORVASTATIN CALCIUM 10 MG PO TABS: 10.0000 mg | ORAL_TABLET | Freq: Every day | ORAL | 3 refills | Status: DC

## 2022-12-27 NOTE — Telephone Encounter (Signed)
-----   Message from Flossie Dibble sent at 12/27/2022 11:31 AM EDT ----- Ms. Fier,   Your cholesterol is elevated, although the lipoprotein A was normal--which is good news. However the recommendations would still be to put you on a moderate intensity statin.  Start atorvastatin 10 mg daily. Repeat FLP and LFTs in 8 weeks.

## 2022-12-27 NOTE — Telephone Encounter (Signed)
Viewed in MyChart Routed to PCP  

## 2022-12-28 ENCOUNTER — Other Ambulatory Visit: Payer: Self-pay | Admitting: Obstetrics & Gynecology

## 2022-12-28 NOTE — Telephone Encounter (Signed)
Medication refill request: estradiol patch  Last AEX:  12/22/21 Next AEX: 01/01/23 Last MMG (if hormonal medication request): 05/03/22 Bi-rads 1 neg  Refill authorized: #8 with 0 rf pended

## 2023-01-01 ENCOUNTER — Ambulatory Visit: Payer: BC Managed Care – PPO | Admitting: Obstetrics and Gynecology

## 2023-01-04 ENCOUNTER — Ambulatory Visit: Payer: Self-pay | Admitting: Obstetrics and Gynecology

## 2023-01-26 ENCOUNTER — Other Ambulatory Visit: Payer: Self-pay

## 2023-01-26 DIAGNOSIS — Z79890 Hormone replacement therapy (postmenopausal): Secondary | ICD-10-CM

## 2023-01-26 MED ORDER — ESTRADIOL 0.05 MG/24HR TD PTTW: 8.0000 | MEDICATED_PATCH | TRANSDERMAL | 0 refills | Status: DC

## 2023-01-26 NOTE — Telephone Encounter (Signed)
Med refill request:estradiol 0.05 mg patch Last AEX: 12/22/21 Next AEX: none scheduled Last MMG (if hormonal med) 05/03/22 Refill authorized:  estradiol 0.05mg  patch #8, needs appointment.  Sent to provider for review.

## 2023-01-26 NOTE — Telephone Encounter (Signed)
Overdue for AEX- has already been renewed x 1 month, needs appt.

## 2023-01-30 ENCOUNTER — Other Ambulatory Visit: Payer: Self-pay | Admitting: Gastroenterology

## 2023-02-03 NOTE — Progress Notes (Signed)
HPI female former smoker followed for COPD, rhinitis, complicated by GERD/Barrett's Her brother [name] had been followed here for chronic respiratory failure with congestive heart failure and history of pulmonary emboli and bullous emphysema. She thought he had an alpha-1 antitrypsin abnormality  a1AT 02/19/13- was normal MM PFT 02/18/13-minimal obstructive airways disease, no response to bronchodilator, FVC 4.13/114%, FEV1 2.32/82%, ratio 0.56, FEF 25-75% 1.05/41%, DLCO 70%, TLC 118% Office Spirometry 08/13/2017-mild obstruction.  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  She has not been acutely ill or had significant exacerbation.  Has not felt need for a chest inhaler.  Continues Atrovent regularly trying to control postnasal drip and we discussed trying Astelin instead.  We will update CXR because of her smoking history. CXR 11/19/20-  IMPRESSION: 1. No acute cardiopulmonary disease. 2. Stable hyperinflation and evidence for emphysematous changes.  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 Declines flu vax. Seasonal increase in post-nasaldrip, dry cough. Some DOE, not progressive, with little wheeze or cough. Eval for syncope 2 months ago, included echo.  Conclusion "dehydration". CXR 04/08/21- IMPRESSION: Hyperinflation consistent with COPD. No acute cardiopulmonary disease.   ROS-see HPI + = positive Constitutional:   No-   weight loss, night sweats, fevers, chills, fatigue, lassitude. HEENT:   No-  headaches, difficulty swallowing, tooth/dental problems, sore throat,       No-  sneezing, itching, ear ache,  nasal congestion, post nasal drip,  CV:  No-   chest pain, orthopnea, PND, swelling in lower extremities, anasarca, dizziness, palpitations Resp: +Per HPI- shortness of breath with exertion or at rest.              No-   productive cough,   +non-productive cough,  No- coughing up of blood.              No-   change in color of mucus.   wheezing.   Skin: No-   rash or lesions. GI:  + heartburn, indigestion, No-abdominal pain, nausea, vomiting,  GU:  MS:  No-   joint pain or swelling.   Neuro-     nothing unusual Psych:  No- change in mood or affect. No depression or anxiety.  No memory loss.  OBJ General- Alert, Oriented, Affect-appropriate, Distress- none acute; well-appearing Skin- rash-none, lesions- none, excoriation- none Lymphadenopathy- none Head- atraumatic            Eyes- Gross vision intact, PERRLA, conjunctivae clear secretions            Ears- Hearing, canals-normal            Nose-clear, no-Septal dev,  polyps- none,  erosion, perforation             Throat-  Mallampati II , mucosa clear , drainage- none, tonsils- atrophic Neck- flexible , trachea midline, no stridor , thyroid nl, carotid no bruit Chest - symmetrical excursion , unlabored           Heart/CV- RRR , no murmur , no gallop  , no rub, nl s1 s2                           - JVD- none , edema- none, stasis changes- none, varices- none           Lung- clear to P&A/ distant, wheeze- none, cough- none , dullness-none, rub- none           Chest wall-  Abd-  Br/ Gen/ Rectal- Not done, not indicated Extrem- cyanosis- none, clubbing, none, atrophy- none,  strength- nl Neuro- grossly intact to observation

## 2023-02-05 ENCOUNTER — Encounter: Payer: Self-pay | Admitting: Internal Medicine

## 2023-02-05 ENCOUNTER — Ambulatory Visit: Payer: Medicare HMO

## 2023-02-05 ENCOUNTER — Ambulatory Visit: Payer: Medicare HMO | Admitting: Internal Medicine

## 2023-02-05 VITALS — BP 149/79 | HR 57 | Ht 66.0 in | Wt 139.2 lb

## 2023-02-05 DIAGNOSIS — J310 Chronic rhinitis: Secondary | ICD-10-CM | POA: Diagnosis not present

## 2023-02-05 DIAGNOSIS — J449 Chronic obstructive pulmonary disease, unspecified: Secondary | ICD-10-CM

## 2023-02-05 DIAGNOSIS — R0609 Other forms of dyspnea: Secondary | ICD-10-CM

## 2023-02-05 MED ORDER — IPRATROPIUM BROMIDE 0.06 % NA SOLN: 2.0000 | Freq: Four times a day (QID) | NASAL | 12 refills | Status: DC

## 2023-02-05 NOTE — Patient Instructions (Signed)
Ipratropium nasal spray refilled  Order- Schedule PFT    dyspnea on exertion  Order- CXR    dx dyspnea on exertion, cough

## 2023-03-06 ENCOUNTER — Encounter: Payer: Self-pay | Admitting: Internal Medicine

## 2023-03-06 NOTE — Assessment & Plan Note (Signed)
Needs severity assessment Plan- CXR, PFT

## 2023-03-06 NOTE — Assessment & Plan Note (Signed)
Ipratropium nasal spray has been effective when used. Plan- refill

## 2023-03-28 ENCOUNTER — Other Ambulatory Visit: Payer: Self-pay | Admitting: Gastroenterology

## 2023-04-27 ENCOUNTER — Other Ambulatory Visit: Payer: Self-pay | Admitting: Gastroenterology

## 2023-04-27 ENCOUNTER — Ambulatory Visit: Payer: PPO | Admitting: Internal Medicine

## 2023-04-27 DIAGNOSIS — R0609 Other forms of dyspnea: Secondary | ICD-10-CM

## 2023-04-27 LAB — PULMONARY FUNCTION TEST
DL/VA % pred: 73 %
DL/VA: 3 ml/min/mmHg/L
DLCO cor % pred: 76 %
DLCO cor: 16.02 ml/min/mmHg
DLCO unc % pred: 76 %
DLCO unc: 16.02 ml/min/mmHg
FEF 25-75 Post: 1.02 L/s
FEF 25-75 Pre: 0.81 L/s
FEF2575-%Change-Post: 25 %
FEF2575-%Pred-Post: 47 %
FEF2575-%Pred-Pre: 37 %
FEV1-%Change-Post: 8 %
FEV1-%Pred-Post: 76 %
FEV1-%Pred-Pre: 70 %
FEV1-Post: 1.95 L
FEV1-Pre: 1.79 L
FEV1FVC-%Change-Post: 0 %
FEV1FVC-%Pred-Pre: 73 %
FEV6-%Change-Post: 6 %
FEV6-%Pred-Post: 104 %
FEV6-%Pred-Pre: 98 %
FEV6-Post: 3.36 L
FEV6-Pre: 3.16 L
FEV6FVC-%Change-Post: -1 %
FEV6FVC-%Pred-Post: 101 %
FEV6FVC-%Pred-Pre: 103 %
FVC-%Change-Post: 8 %
FVC-%Pred-Post: 103 %
FVC-%Pred-Pre: 95 %
FVC-Post: 3.45 L
FVC-Pre: 3.18 L
Post FEV1/FVC ratio: 56 %
Post FEV6/FVC ratio: 98 %
Pre FEV1/FVC ratio: 56 %
Pre FEV6/FVC Ratio: 99 %
RV % pred: 164 %
RV: 3.66 L
TLC % pred: 136 %
TLC: 7.3 L

## 2023-04-30 ENCOUNTER — Telehealth: Payer: Self-pay | Admitting: Internal Medicine

## 2023-04-30 NOTE — Progress Notes (Signed)
 Called the pt and there was no answer- LMTCB

## 2023-04-30 NOTE — Telephone Encounter (Signed)
Patient is returning phone call. Patient phone number is [phone].

## 2023-05-01 NOTE — Telephone Encounter (Signed)
Patient is returning missed call in reference to Pft. [phone]

## 2023-05-02 MED ORDER — TRELEGY ELLIPTA 100-62.5-25 MCG/ACT IN AEPB: 1.0000 | INHALATION_SPRAY | Freq: Every day | RESPIRATORY_TRACT | 5 refills | Status: DC

## 2023-05-02 NOTE — Telephone Encounter (Signed)
 Patient has been notified of PFT results. I will send trelegy in.

## 2023-05-08 ENCOUNTER — Other Ambulatory Visit: Payer: Self-pay | Admitting: Internal Medicine

## 2023-05-08 ENCOUNTER — Other Ambulatory Visit: Payer: Self-pay | Admitting: Gastroenterology

## 2023-05-09 ENCOUNTER — Telehealth: Payer: Self-pay | Admitting: Gastroenterology

## 2023-05-09 MED ORDER — LANSOPRAZOLE 30 MG PO CPDR: 30.0000 mg | DELAYED_RELEASE_CAPSULE | Freq: Every day | ORAL | 1 refills | Status: DC

## 2023-05-09 NOTE — Telephone Encounter (Signed)
Patient called and stated that she is needing a refill on PEVACID. Patient has already made an appointment for 07/03/2023 and is a medication refill until then. Please advise.

## 2023-05-09 NOTE — Telephone Encounter (Signed)
Done

## 2023-05-21 ENCOUNTER — Telehealth: Payer: Self-pay | Admitting: Internal Medicine

## 2023-05-21 NOTE — Telephone Encounter (Signed)
 New message    Patient in the office asking for samples of medication:   1.  What medication and dosage are you requesting samples for? Fluticasone-Umeclidin-Vilant (TRELEGY ELLIPTA) 100-62.5-25 MCG/ACT AEPB   2.  Are you currently out of this medication? The cost is high.

## 2023-05-21 NOTE — Telephone Encounter (Signed)
 Please let her have 2 samples of Trelegy 100 if we have them. Inhale 1 puff then rinse mouth, once daily.

## 2023-05-21 NOTE — Telephone Encounter (Signed)
 Called and spoke with patient, she states that after she got her PFT results Dr. Maple Hudson wanted her to try Trelegy inhaler.  She went to the pharmacy and it was over a hundred dollars.  She wanted to see if it would help before she paid that price is why she was asking for a sample.  I asked her if she has a deductible to meet and she did not know.  I let her know I would ask Dr. Maple Hudson about the sample of trelegy.  She will call her insurance company and ask about a deductible and if she does not have a deductible she will as what is on the preferred list and get that list to [place].  Dr. Maple Hudson, is it ok to provide patient with sample of Trelegy?  If so, what strength?  Thank you.

## 2023-05-28 MED ORDER — TRELEGY ELLIPTA 100-62.5-25 MCG/ACT IN AEPB: 1.0000 | INHALATION_SPRAY | Freq: Every day | RESPIRATORY_TRACT | Status: DC

## 2023-05-28 NOTE — Telephone Encounter (Signed)
 Called patient.  Gave information.  Left 2 samples of Trelegy 100 mcg at front desk.  Patient will pick up one day this week.  Patient verbalized understanding of directions.

## 2023-07-03 ENCOUNTER — Ambulatory Visit: Payer: PPO | Admitting: Gastroenterology

## 2023-07-20 ENCOUNTER — Encounter: Payer: Self-pay | Admitting: Gastroenterology

## 2023-07-20 ENCOUNTER — Ambulatory Visit: Admitting: Gastroenterology

## 2023-07-20 VITALS — BP 120/70 | HR 76 | Ht 65.35 in | Wt 137.4 lb

## 2023-07-20 DIAGNOSIS — K219 Gastro-esophageal reflux disease without esophagitis: Secondary | ICD-10-CM | POA: Diagnosis not present

## 2023-07-20 DIAGNOSIS — R490 Dysphonia: Secondary | ICD-10-CM

## 2023-07-20 DIAGNOSIS — K2270 Barrett's esophagus without dysplasia: Secondary | ICD-10-CM

## 2023-07-20 DIAGNOSIS — R0989 Other specified symptoms and signs involving the circulatory and respiratory systems: Secondary | ICD-10-CM

## 2023-07-20 MED ORDER — LANSOPRAZOLE 30 MG PO CPDR: 30.0000 mg | DELAYED_RELEASE_CAPSULE | Freq: Every day | ORAL | 4 refills | Status: DC

## 2023-07-20 MED ORDER — FAMOTIDINE 20 MG PO TABS: 20.0000 mg | ORAL_TABLET | Freq: Every day | ORAL | 1 refills | Status: DC

## 2023-07-20 NOTE — Patient Instructions (Signed)
 We have sent the following medications to your pharmacy for you to pick up at your convenience: Prevacid  30 mg , Pepcid 20 mg.  Please call if you develop new symptoms.  _______________________________________________________  If your blood pressure at your visit was 140/90 or greater, please contact your primary care physician to follow up on this.  _______________________________________________________  If you are age 68 or older, your body mass index should be between 23-30. Your Body mass index is 22.61 kg/m. If this is out of the aforementioned range listed, please consider follow up with your Primary Care Provider.  If you are age 65 or younger, your body mass index should be between 19-25. Your Body mass index is 22.61 kg/m. If this is out of the aformentioned range listed, please consider follow up with your Primary Care Provider.   ________________________________________________________  The [facility] providers would like to encourage you to use MYCHART to communicate with providers for non-urgent requests or questions.  Due to long hold times on the telephone, sending your provider a message by [facility] may be a faster and more efficient way to get a response.  Please allow 48 business hours for a response.  Please remember that this is for non-urgent requests.  _______________________________________________________ Thank you,  Dr. Lajuan Pila

## 2023-07-20 NOTE — Progress Notes (Signed)
 Chief Complaint: FU  Referring Provider:  Melva Stabile, MD      ASSESSMENT AND PLAN;   #1. GERD with short segment Barrett's esophagus (EGD 06/2021 no dysplasia). #2.  Recurrent throat clearing with occ hoarseness. ?etiology-reflux vs allergies vs postnasal drip/sinusitis.   Plan: - Pravacid 30mg  po qd #90, 4RF - Pepcid  20mg  po QHS - EGD 07/2024 for surveillance. - Call us  in 4 weeks, if still with problems, ref to Dr Jerelene Monday followed by ENT. - Patient is to call us  if she starts having any new problems. HPI:    Cheyenne Gallegos is a 68 y.o. female  For follow-up visit   Discussed the use of AI scribe software for clinical note transcription with the patient, who gave verbal consent to proceed.  History of Present Illness Cheyenne Gallegos is a 68 year old female with Barrett's esophagus who presents with persistent throat clearing and possible reflux symptoms.  She has been experiencing persistent throat clearing for the past three to four months, primarily in the evenings, which interferes with her ability to speak. The sensation is described as 'thick' and is more pronounced in the evenings, although it can occur at other times, such as in the morning.  She denies regular heartburn but experiences occasional episodes at night that wake her up, resolving with drinking water. She recalls being diagnosed with Barrett's esophagus and acid reflux in the past but does not frequently experience typical reflux symptoms like heartburn. She is currently taking Prevacid  once daily, which she feels is effective without any problems.  She has a history of being told she might have a hiatal hernia, although she does not recall the details. Additionally, she mentions a past diagnosis of osteopenia but has not had a bone density scan in about five years. She takes Claritin at night, especially during high pollen seasons, to manage nasal symptoms.  She has a Engineer, water, which is  hypoallergenic and does not shed. Despite having environmental allergies in the past, she has not seen an allergy doctor before.  No nausea, vomiting, heartburn, regurgitation, odynophagia or dysphagia.  No significant diarrhea or constipation.  There is no melena or hematochezia. No unintentional weight loss.   PastGI: EGD 06/2021: - Z- line irregular, 38 cm from the incisors. Biopsied. - Small transient hiatal hernia. Bx-Barrett's esophagus. -Colonoscopy 12/2016: Except for small internal hemorrhoids.  Repeat in 10 years.  Earlier, if with any new problems. Past Medical History:  Diagnosis Date   ADD (attention deficit disorder with hyperactivity)    Allergy    seasonal   Anemia    PAST HX    Arthritis    thumbs   Barretts esophagus    Condyloma    vulvar   COPD (chronic obstructive pulmonary disease) (HCC)    mild    Emphysema of lung (HCC)    GERD (gastroesophageal reflux disease)    Hypercholesteremia    Insomnia    Nausea    Normal spontaneous vaginal delivery    Osteopenia    Tachycardia    VIN I (vulvar intraepithelial neoplasia I)    left labia   VIN III (vulvar intraepithelial neoplasia III)    Vitamin D  deficiency     Past Surgical History:  Procedure Laterality Date   BREAST BIOPSY  [phone]   BREAST EXCISIONAL BIOPSY Right 1979   BREAST EXCISIONAL BIOPSY Right 1980   BREAST EXCISIONAL BIOPSY Right 1983   BREAST EXCISIONAL BIOPSY Right 1986  CARDIOVASCULAR STRESS TEST  12/17/2009   Images showed normal pattern of perfusion in all regions. EKG is negative for ischemia.   co2 laser of labia     COLONOSCOPY  01/05/2017   Small internal hemorrhoids. Otherwise normal colonoscopy.    ESOPHAGOGASTRODUODENOSCOPY  08/20/2015   Barretts esophagus. Gastric poylp.   FOOT SURGERY  2001   HAND SURGERY Right Apr 28 2014   TOTAL VAGINAL HYSTERECTOMY  1995   TUBAL LIGATION     UPPER GASTROINTESTINAL ENDOSCOPY     VULVA SURGERY      Family History  Problem  Relation Age of Onset   Hypertension Mother    Diabetes Mother    Heart disease Mother    Chronic Renal Failure Mother    Arrhythmia Mother        Atrial Fibrillation   Colon cancer Paternal Grandmother    Cancer Paternal Grandmother        Colon cancer   Heart failure Maternal Grandmother    Heart attack Maternal Grandfather    COPD Brother    Lung disease Brother    Breast cancer Neg Hx    Colon polyps Neg Hx    Esophageal cancer Neg Hx    Rectal cancer Neg Hx    Stomach cancer Neg Hx     Social History   Tobacco Use   Smoking status: Former    Current packs/day: 0.00    Average packs/day: 1.5 packs/day for 28.0 years (42.0 ttl pk-yrs)    Types: Cigarettes    Start date: 02/05/1972    Quit date: 02/05/2000    Years since quitting: 23.4    Passive exposure: Past ("husband smoked")   Smokeless tobacco: Never  Vaping Use   Vaping status: Never Used  Substance Use Topics   Alcohol use: Yes    Comment: occasionally-mixed drink   Drug use: No    Current Outpatient Medications  Medication Sig Dispense Refill   AZO-CRANBERRY PO Take 1 tablet by mouth 2 (two) times daily.     Cetirizine HCl (ZYRTEC PO) Take 1 tablet by mouth daily.      cholecalciferol (VITAMIN D3) 25 MCG (1000 UT) tablet Take 1,000 Units by mouth daily.     Fluticasone-Umeclidin-Vilant (TRELEGY ELLIPTA ) 100-62.5-25 MCG/ACT AEPB Inhale 1 puff into the lungs daily. 28 each 5   ipratropium (ATROVENT ) 0.06 % nasal spray Place 2 sprays into both nostrils 4 (four) times daily. 15 mL 12   lansoprazole  (PREVACID ) 30 MG capsule Take 1 capsule (30 mg total) by mouth daily. Please call [phone] to schedule an office visit for more refills 30 capsule 1   Magnesium 400 MG CAPS Take 800 mg by mouth daily.     Multiple Vitamins-Minerals (PRESERVISION AREDS PO) Take 2 tablets by mouth daily at 6 (six) AM.     rOPINIRole (REQUIP) 1 MG tablet Take 1 mg by mouth at bedtime.     valACYclovir (VALTREX) 1000 MG tablet  Take 1,000 mg by mouth as needed (oUT BREAKS).     vitamin C (ASCORBIC ACID) 500 MG tablet Take 2,000 mg by mouth as needed.     vitamin E 400 UNIT capsule Take 400 Units by mouth daily.     zolpidem  (AMBIEN ) 5 MG tablet Take 5 mg by mouth at bedtime as needed for sleep.     metFORMIN (GLUCOPHAGE-XR) 500 MG 24 hr tablet Take 500 mg by mouth daily with breakfast. (Patient not taking: Reported on 07/20/2023)     No current  facility-administered medications for this visit.    Allergies  Allergen Reactions   Morphine Nausea And Vomiting    Review of Systems:  Negative except for HPI     Physical Exam:    BP 120/70 (BP Location: Left Arm, Patient Position: Sitting, Cuff Size: Normal)   Pulse 76   Ht 5' 5.35" (1.66 m) Comment: height measured without shoes  Wt 137 lb 6 oz (62.3 kg)   BMI 22.61 kg/m  Filed Weights   07/20/23 1101  Weight: 137 lb 6 oz (62.3 kg)   Constitutional:  Well-developed, in no acute distress. Psychiatric: Normal mood and affect. Behavior is normal. HEENT: Pupils normal.  Conjunctivae are normal. No scleral icterus.  Has postnasal drip. Pulmonary/chest: Effort normal and breath sounds normal. No wheezing, rales or rhonchi. Abdominal: Soft, nondistended. Nontender. Bowel sounds active throughout. There are no masses palpable. No hepatomegaly.  Data Reviewed: I have personally reviewed following labs and imaging studies  CBC:    Latest Ref Rng & Units 06/10/2022    6:17 PM 12/12/2018    9:12 AM 02/12/2018    9:01 AM  CBC  WBC 4.0 - 10.5 K/uL 5.6  4.2  4.6   Hemoglobin 12.0 - 15.0 g/dL 09.8  11.9  14.7   Hematocrit 36.0 - 46.0 % 40.2  37.4  37.9   Platelets 150 - 400 K/uL 274  292  280     CMP:    Latest Ref Rng & Units 06/10/2022    6:17 PM 12/12/2018    9:12 AM 02/12/2018    9:01 AM  CMP  Glucose 70 - 99 mg/dL 829  88  77   BUN 8 - 23 mg/dL 20  20  19    Creatinine 0.44 - 1.00 mg/dL 5.62  1.30  8.65   Sodium 135 - 145 mmol/L 138  139  139    Potassium 3.5 - 5.1 mmol/L 4.4  4.1  4.2   Chloride 98 - 111 mmol/L 105  107  104   CO2 22 - 32 mmol/L 26  29  27    Calcium  8.9 - 10.3 mg/dL 8.9  9.1  9.2   Total Protein 6.1 - 8.1 g/dL  6.0  6.1   Total Bilirubin 0.2 - 1.2 mg/dL  0.4  0.4   AST 10 - 35 U/L  21  23   ALT 6 - 29 U/L  19  20      Radiology Studies: No results found.     Magnus Schuller, MD 07/20/2023, 11:34 AM  Cc: Melva Stabile, MD

## 2023-08-06 ENCOUNTER — Telehealth: Payer: Self-pay | Admitting: Gastroenterology

## 2023-08-06 NOTE — Telephone Encounter (Signed)
 Patient called and stated that she has been doing what Dr. Venice Gillis had suggested that she do for 30 days but has not seen any kind of improvement. Patient stated that she is now having more of a raspy voice. Patient stated that she is now unsure what her next's steps would be and is requesting a call back.

## 2023-08-07 ENCOUNTER — Other Ambulatory Visit: Payer: Self-pay

## 2023-08-07 DIAGNOSIS — R490 Dysphonia: Secondary | ICD-10-CM

## 2023-08-07 NOTE — Telephone Encounter (Signed)
 Pt stated that her symptoms are not improving. Pt chart was reviewed and noted Dr Venice Gillis recommendations were to refer her to Dr. Kozlow if her symptoms continued. Pt was made aware. Referral was sent via epic. Pt made aware. Pt was notified that if there office has not contacted her within a week or two than to give our office a call back.  Pt verbalized understanding with all questions answered.

## 2023-09-29 NOTE — Progress Notes (Unsigned)
 HPI female former smoker followed for COPD, rhinitis, complicated by GERD/Barrett's Her brother Daril Lobstein had been followed here for chronic respiratory failure with congestive heart failure and history of pulmonary emboli and bullous emphysema. She thought he had an alpha-1 antitrypsin abnormality  a1AT 02/19/13- was normal MM PFT 02/18/13-minimal obstructive airways disease, no response to bronchodilator, FVC 4.13/114%, FEV1 2.32/82%, ratio 0.56, FEF 25-75% 1.05/41%, DLCO 70%, TLC 118% Office Spirometry 08/13/2017-mild obstruction.  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 Declines flu vax. Seasonal increase in post-nasal drip, dry cough. Some DOE, not progressive, with little wheeze or cough. Eval for syncope 2 months ago, included echo. Conclusion dehydration. CXR 04/08/21- IMPRESSION: Hyperinflation consistent with COPD. No acute cardiopulmonary disease.  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 COPD symptomatic mainly with DOE, so Spiriva may be better choice, if any bronchodilator needed. Discussed the use of AI scribe software for clinical note transcription with the patient, who gave verbal consent to proceed.  History of Present Illness   Cheyenne Gallegos is a 68 year old female with emphysema who presents for follow-up of her pulmonary condition.  She experienced throat irritation with Trelegy, which improved after discontinuation, though some symptoms persist. Trelegy initially improved her stair-climbing ability, but the effect was not sustained. Her pulmonary function test in January showed a mild emphysema pattern. Silent reflux may contribute to her throat irritation. Her last chest x-ray in November was stable. Her condition has remained mild over the past five years. She is concerned about the potential need for oxygen in the future. She maintains physical activity by using stairs at work, which benefits her stamina and leg strength.     Assessment and Plan:    Emphysema Mild emphysema stable since last pulmonary function test. Exertional dyspnea without progression. No oxygen therapy needed. - Encourage regular physical activity to maintain stamina and leg strength. - Avoid powder-based inhalers due to throat irritation. - Consider alternative inhalers if symptoms worsen. - Annual chest X-ray in November.  Gastroesophageal reflux disease (GERD) Silent GERD contributing to throat irritation, managed with lifestyle modifications. - Continue lifestyle modifications.    History of Present Illness  ROS-see HPI + = positive Constitutional:   No-   weight loss, night sweats, fevers, chills, fatigue, lassitude. HEENT:   No-  headaches, difficulty swallowing, tooth/dental problems, sore throat,       No-  sneezing, itching, ear ache,  nasal congestion, post nasal drip,  CV:  No-   chest pain, orthopnea, PND, swelling in lower  extremities, anasarca, dizziness, palpitations Resp: +Per HPI- shortness of breath with exertion or at rest.              No-   productive cough,   +non-productive cough,  No- coughing up of blood.              No-   change in color of mucus.   wheezing.   Skin: No-   rash or lesions. GI:  + heartburn, indigestion, No-abdominal pain, nausea, vomiting,  GU:  MS:  No-   joint pain or swelling.   Neuro-     nothing unusual Psych:  No- change in mood or affect. No depression or anxiety.  No memory loss.  OBJ General- Alert, Oriented, Affect-appropriate, Distress- none acute; well-appearing Skin- rash-none, lesions- none, excoriation- none Lymphadenopathy- none Head- atraumatic            Eyes- Gross vision intact, PERRLA, conjunctivae clear secretions            Ears- Hearing, canals-normal            Nose-clear, no-Septal dev,  polyps- none,  erosion, perforation             Throat- Mallampati II , mucosa clear , drainage- none, tonsils- atrophic Neck- flexible , trachea midline, no stridor , thyroid nl, carotid no bruit Chest - symmetrical excursion , unlabored           Heart/CV- RRR , no murmur , no gallop  , no rub, nl s1 s2                           - JVD- none , edema- none, stasis changes- none, varices- none           Lung- clear to P&A/ distant, wheeze- none, cough- none , dullness-none, rub- none           Chest wall-  Abd-  Br/ Gen/ Rectal- Not done, not indicated Extrem- cyanosis- none, clubbing, none, atrophy- none, strength- nl Neuro- grossly intact to observation

## 2023-10-01 ENCOUNTER — Encounter: Payer: Self-pay | Admitting: Allergy and Immunology

## 2023-10-01 ENCOUNTER — Ambulatory Visit: Payer: Self-pay | Admitting: Allergy and Immunology

## 2023-10-01 VITALS — BP 140/80 | HR 64 | Resp 10 | Ht 66.0 in | Wt 138.4 lb

## 2023-10-01 DIAGNOSIS — Z888 Allergy status to other drugs, medicaments and biological substances status: Secondary | ICD-10-CM

## 2023-10-01 DIAGNOSIS — K219 Gastro-esophageal reflux disease without esophagitis: Secondary | ICD-10-CM | POA: Diagnosis not present

## 2023-10-01 DIAGNOSIS — T50905A Adverse effect of unspecified drugs, medicaments and biological substances, initial encounter: Secondary | ICD-10-CM

## 2023-10-01 DIAGNOSIS — J449 Chronic obstructive pulmonary disease, unspecified: Secondary | ICD-10-CM

## 2023-10-01 MED ORDER — LANSOPRAZOLE 30 MG PO CPDR: DELAYED_RELEASE_CAPSULE | ORAL | 5 refills | Status: DC

## 2023-10-01 NOTE — Progress Notes (Unsigned)
 [place] - [place] - [place] - [place] - [place]   Dear Charlanne,  Thank you for referring Cheyenne Gallegos to the [facility]  on 10/01/2023.   Below is a summation of this patient's evaluation and recommendations.  Thank you for your referral. I will keep you informed about this patient's response to treatment.   If you have any questions please do not hesitate to contact me.   Sincerely,  Camellia DOROTHA Denis, MD Allergy / Immunology [facility] [place]    ______________________________________________________________________    NEW PATIENT NOTE  Referring Provider: Charlanne Groom, MD Primary Provider: Silver Lamar LABOR, MD Date of office visit: 10/01/2023    Subjective:   Chief Complaint:  Cheyenne Gallegos (DOB: 10/18/1955) is a 68 y.o. female who presents to the clinic on 10/01/2023 with a chief complaint of post nasal drip .     HPI: Feleshia presents to this clinic in evaluation of throat clearing.  About 4 months ago she started Trelegy inhaler and over the course of the past 2 months she has had lots of throat clearing and intermittent loss of voice and feeling as though her throat is irritated and she stopped the Trelegy and has been without this medication for about 4 weeks and she is much improved.  She only now has some lingering throat clearing and some phlegm stuck in her throat but certainly much improved since she stopped Trelegy.  She does have reflux disease associated with Barrett's esophagus.  She is using Prevacid  every day.  She intermittently has caffeine and chocolate and alcohol.  She has no classic reflux symptoms and was evaluated for reflux because of the chest pain issue.  She carries the diagnosis of COPD from smoking and she is followed by Dr. Quita Salt, pulmonology, for this issue.  Past Medical History:  Diagnosis Date   ADD (attention deficit disorder  with hyperactivity)    Allergy    seasonal   Anemia    PAST HX    Arthritis    thumbs   Barretts esophagus    Condyloma    vulvar   COPD (chronic obstructive pulmonary disease) (HCC)    mild    Emphysema of lung (HCC)    GERD (gastroesophageal reflux disease)    Hiatal hernia    Hypercholesteremia    Insomnia    Macular degeneration    Meniere disease    Nausea    Normal spontaneous vaginal delivery    Osteopenia    Tachycardia    VIN I (vulvar intraepithelial neoplasia I)    left labia   VIN III (vulvar intraepithelial neoplasia III)    Vitamin D  deficiency     Past Surgical History:  Procedure Laterality Date   BREAST BIOPSY  [phone]   BREAST EXCISIONAL BIOPSY Right 1979   BREAST EXCISIONAL BIOPSY Right 1980   BREAST EXCISIONAL BIOPSY Right 1983   BREAST EXCISIONAL BIOPSY Right 1986   CARDIOVASCULAR STRESS TEST  12/17/2009   Images showed normal pattern of perfusion in all regions. EKG is negative for ischemia.   co2 laser of labia     COLONOSCOPY  01/05/2017   Small internal hemorrhoids. Otherwise normal colonoscopy.    ESOPHAGOGASTRODUODENOSCOPY  08/20/2015   Barretts esophagus. Gastric poylp.   FOOT SURGERY  2001   HAND SURGERY Right Apr 28 2014   TOTAL VAGINAL HYSTERECTOMY  1995   TUBAL LIGATION     UPPER  GASTROINTESTINAL ENDOSCOPY     VULVA SURGERY      Allergies as of 10/01/2023       Reactions   Morphine Nausea And Vomiting        Medication List    ADK PO Take by mouth daily.   B12 FOLATE PO Take by mouth daily.   ipratropium 0.06 % nasal spray Commonly known as: ATROVENT  Place 2 sprays into both nostrils 4 (four) times daily.   lansoprazole  30 MG capsule Commonly known as: PREVACID  Take 1 capsule (30 mg total) by mouth daily. Please call [phone] to schedule an office visit for more refills   loratadine 10 MG tablet Commonly known as: CLARITIN Take 10 mg by mouth daily.   metFORMIN 500 MG 24 hr tablet Commonly known as:  GLUCOPHAGE-XR Take 500 mg by mouth daily with breakfast.   NUTRITIONAL SUPPLEMENT PO Take by mouth daily. Dimpro   NUTRITIONAL SUPPLEMENT PO Take by mouth. MK-7 Vitamin K2   NUTRITIONAL SUPPLEMENT PO Take by mouth daily. Mag7 Magnesium   pramipexole 1 MG tablet Commonly known as: MIRAPEX Take 1 mg by mouth 3 (three) times daily. What changed: when to take this   PRESERVISION AREDS 2 PO Take by mouth 2 (two) times daily.   TURMERIC PO Take by mouth daily.   zolpidem  5 MG tablet Commonly known as: AMBIEN  Take 5 mg by mouth at bedtime as needed for sleep.    Review of systems negative except as noted in HPI / PMHx or noted below:  Review of Systems  Constitutional: Negative.   HENT: Negative.    Eyes: Negative.   Respiratory: Negative.    Cardiovascular: Negative.   Gastrointestinal: Negative.   Genitourinary: Negative.   Musculoskeletal: Negative.   Skin: Negative.   Neurological: Negative.   Endo/Heme/Allergies: Negative.   Psychiatric/Behavioral: Negative.      Family History  Problem Relation Age of Onset   Hypertension Mother    Diabetes Mother    Heart disease Mother    Chronic Renal Failure Mother    Arrhythmia Mother        Atrial Fibrillation   Stroke Mother    Macular degeneration Father    COPD Brother    Lung disease Brother    Heart failure Maternal Grandmother    Heart attack Maternal Grandfather    Colon cancer Paternal Grandmother    Cancer Paternal Grandmother        Colon cancer   Breast cancer Neg Hx    Colon polyps Neg Hx    Esophageal cancer Neg Hx    Rectal cancer Neg Hx    Stomach cancer Neg Hx     Social History   Socioeconomic History   Marital status: Married    Spouse name: Not on file   Number of children: Not on file   Years of education: Not on file   Highest education level: Not on file  Occupational History   Occupation: superivsor at [employer]  Tobacco Use   Smoking status: Former    Current packs/day: 0.00     Average packs/day: 1.5 packs/day for 28.0 years (42.0 ttl pk-yrs)    Types: Cigarettes    Start date: 02/05/1972    Quit date: 02/05/2000    Years since quitting: 23.6    Passive exposure: Past (husband smoked)   Smokeless tobacco: Never  Vaping Use   Vaping status: Never Used  Substance and Sexual Activity   Alcohol use: Yes    Comment: occasionally-mixed drink  Drug use: No   Sexual activity: Yes    Partners: Male    Birth control/protection: Surgical    Comment: 1st intercourse 68 yo-Fewer than 5 partners, hysterectomy  Other Topics Concern   Not on file  Social History Narrative   Not on file   Social Drivers of Health   Financial Resource Strain: Not on file  Food Insecurity: Low Risk  (07/17/2023)   Received from [facility]   Hunger Vital Sign    Within the past 12 months, you worried that your food would run out before you got money to buy more: Never true    Within the past 12 months, the food you bought just didn't last and you didn't have money to get more. : Never true  Transportation Needs: No Transportation Needs (07/17/2023)   Received from [facility]    In the past 12 months, has lack of reliable transportation kept you from medical appointments, meetings, work or from getting things needed for daily living? : No  Physical Activity: Not on file  Stress: Not on file  Social Connections: Not on file  Intimate Partner Violence: Not on file    Environmental and Social history  Mareta lives in a townhouse with a dry environment, a dog located inside the household, no carpet in the bedroom, plastic on the bed, plastic on the pillow, occasional smoke exposure to vape material.  Objective:   Vitals:   10/01/23 1439  BP: (!) 140/80  Pulse: 64  Resp: 10  SpO2: 94%   Height: 5' 6 (167.6 cm) Weight: 138 lb 6.4 oz (62.8 kg)  Physical Exam Constitutional:      Appearance: She is not diaphoretic.  HENT:     Head: Normocephalic.      Right Ear: Tympanic membrane, ear canal and external ear normal.     Left Ear: Tympanic membrane, ear canal and external ear normal.     Nose: Nose normal. No mucosal edema or rhinorrhea.     Mouth/Throat:     Pharynx: Uvula midline. No oropharyngeal exudate.  Eyes:     Conjunctiva/sclera: Conjunctivae normal.  Neck:     Thyroid: No thyromegaly.     Trachea: Trachea normal. No tracheal tenderness or tracheal deviation.  Cardiovascular:     Rate and Rhythm: Normal rate and regular rhythm.     Heart sounds: Normal heart sounds, S1 normal and S2 normal. No murmur heard. Pulmonary:     Effort: No respiratory distress.     Breath sounds: Normal breath sounds. No stridor. No wheezing or rales.  Lymphadenopathy:     Head:     Right side of head: No tonsillar adenopathy.     Left side of head: No tonsillar adenopathy.     Cervical: No cervical adenopathy.  Skin:    Findings: No erythema or rash.     Nails: There is no clubbing.  Neurological:     Mental Status: She is alert.     Diagnostics: Allergy skin tests were not performed.     Assessment and Plan:    No diagnosis found.  Patient Instructions   1. Treat LPR:   A. Increase Prevacid  30 mg - 1 tablet 2 times per day  B. Minimize caffeine, chocolate, alcohol  C. Replace throat clearing with drinking / swallowing maneuver  2. Engage in progressive aerobic exercise program     Camellia DOROTHA Denis, MD Allergy / Immunology [facility] [place]  [place]

## 2023-10-01 NOTE — Patient Instructions (Addendum)
  1. Treat LPR:   A. Increase Prevacid  30 mg - 1 tablet 2 times per day  B. Minimize caffeine, chocolate, alcohol  C. Replace throat clearing with drinking / swallowing maneuver  2. Engage in progressive aerobic exercise program

## 2023-10-02 ENCOUNTER — Encounter: Payer: Self-pay | Admitting: Allergy and Immunology

## 2023-10-02 ENCOUNTER — Ambulatory Visit: Admitting: Internal Medicine

## 2023-10-02 ENCOUNTER — Encounter: Payer: Self-pay | Admitting: Internal Medicine

## 2023-10-02 VITALS — BP 102/60 | HR 71 | Temp 98.0°F | Ht 66.0 in | Wt 137.6 lb

## 2023-10-02 DIAGNOSIS — Z87891 Personal history of nicotine dependence: Secondary | ICD-10-CM | POA: Diagnosis not present

## 2023-10-02 DIAGNOSIS — J449 Chronic obstructive pulmonary disease, unspecified: Secondary | ICD-10-CM

## 2023-10-02 NOTE — Patient Instructions (Signed)
 If you need an inhaler in the future, we might try Spiriva. We will avoid dry powder and steroid inhalers because of the throat irritation.

## 2023-10-10 ENCOUNTER — Other Ambulatory Visit: Payer: Self-pay | Admitting: Internal Medicine

## 2023-10-18 ENCOUNTER — Ambulatory Visit (HOSPITAL_BASED_OUTPATIENT_CLINIC_OR_DEPARTMENT_OTHER)
Admission: EM | Admit: 2023-10-18 | Discharge: 2023-10-18 | Disposition: A | Discharge status: Home / Self Care | Attending: Family Medicine | Admitting: Family Medicine

## 2023-10-18 ENCOUNTER — Encounter (HOSPITAL_BASED_OUTPATIENT_CLINIC_OR_DEPARTMENT_OTHER): Payer: Self-pay

## 2023-10-18 DIAGNOSIS — R319 Hematuria, unspecified: Secondary | ICD-10-CM | POA: Diagnosis not present

## 2023-10-18 LAB — POCT URINALYSIS DIP (MANUAL ENTRY)
Bilirubin, UA: NEGATIVE
Glucose, UA: NEGATIVE mg/dL
Ketones, POC UA: NEGATIVE mg/dL
Nitrite, UA: NEGATIVE
Protein Ur, POC: NEGATIVE mg/dL
Spec Grav, UA: 1.01 (ref 1.010–1.025)
Urobilinogen, UA: 0.2 U/dL
pH, UA: 5.5 (ref 5.0–8.0)

## 2023-10-18 MED ORDER — NITROFURANTOIN MONOHYD MACRO 100 MG PO CAPS: 100.0000 mg | ORAL_CAPSULE | Freq: Two times a day (BID) | ORAL | 0 refills | Status: DC

## 2023-10-18 NOTE — ED Triage Notes (Signed)
 Pt states that she has been having hematuria, pelvic, and lower back pain for the last 2 days. Pt denies hx of kidney stones. Pt denies urinary frequency or urgency. She has not taken anything for her symptoms.

## 2023-10-18 NOTE — Discharge Instructions (Signed)
 Your urine did have some bacteria and blood.  I am culturing the urine.  Will go ahead and treat for urinary tract infection at this time.  We will call you with any changes if needed when we get your culture results.  Make sure you are drinking plenty of fluids.  Follow-up as needed

## 2023-10-18 NOTE — ED Provider Notes (Signed)
 [facility]    CSN: [id number] Arrival date & time: 10/18/23  1515      History   Chief Complaint Chief Complaint  Patient presents with   Hematuria    HPI Cheyenne Gallegos is a 68 y.o. female.   68 year old female presents today with potential UTI.  Pt states that she has been having hematuria, pelvic, and lower back pain for the last 2 days. Pt denies hx of kidney stones. Pt denies urinary frequency or urgency. She has not taken anything for her symptoms.  No fever, chills, body aches or night sweats.   Hematuria    Past Medical History:  Diagnosis Date   ADD (attention deficit disorder with hyperactivity)    Allergy    seasonal   Anemia    PAST HX    Arthritis    thumbs   Barretts esophagus    Condyloma    vulvar   COPD (chronic obstructive pulmonary disease) (HCC)    mild    Emphysema of lung (HCC)    GERD (gastroesophageal reflux disease)    Hiatal hernia    Hypercholesteremia    Insomnia    Macular degeneration    Meniere disease    Nausea    Normal spontaneous vaginal delivery    Osteopenia    Tachycardia    VIN I (vulvar intraepithelial neoplasia I)    left labia   VIN III (vulvar intraepithelial neoplasia III)    Vitamin D  deficiency     Patient Active Problem List   Diagnosis Date Noted   Syncope and collapse 09/05/2022   Supraventricular tachycardia (HCC) 09/05/2022   Vomiting without nausea 08/23/2022   Gastroesophageal reflux disease 08/23/2022   Constipation 08/23/2022   Chest pain 08/23/2022   Screening for malignant neoplasm of colon 08/23/2022   Insomnia 03/11/2020   Peripheral polyneuropathy 03/11/2020   Need for immunization against influenza 03/11/2020   Situational anxiety 12/11/2019   Encounter for physical examination related to employment 12/11/2019   Raynaud's disease without gangrene 09/05/2017   Restless leg syndrome 09/05/2017   High risk medication use 09/04/2017   Attention deficit disorder (ADD) in  adult 02/16/2017   Recurrent cold sores 02/16/2017   Palpitations 02/20/2013   Menopause 10/16/2011   H/O vitamin D  deficiency 10/16/2011   VIN III (vulvar intraepithelial neoplasia III) 10/16/2011   VIN I (vulvar intraepithelial neoplasia I) 10/16/2011   Barrett esophagus 10/16/2011   Osteopenia 10/16/2011   Hypercholesterolemia 10/16/2011   Chronic rhinitis 06/30/2008   COPD mixed type (HCC) 06/29/2008   DYSPNEA 06/29/2008    Past Surgical History:  Procedure Laterality Date   BREAST BIOPSY  [phone]   BREAST EXCISIONAL BIOPSY Right 1979   BREAST EXCISIONAL BIOPSY Right 1980   BREAST EXCISIONAL BIOPSY Right 1983   BREAST EXCISIONAL BIOPSY Right 1986   CARDIOVASCULAR STRESS TEST  12/17/2009   Images showed normal pattern of perfusion in all regions. EKG is negative for ischemia.   co2 laser of labia     COLONOSCOPY  01/05/2017   Small internal hemorrhoids. Otherwise normal colonoscopy.    ESOPHAGOGASTRODUODENOSCOPY  08/20/2015   Barretts esophagus. Gastric poylp.   FOOT SURGERY  2001   HAND SURGERY Right Apr 28 2014   TOTAL VAGINAL HYSTERECTOMY  1995   TUBAL LIGATION     UPPER GASTROINTESTINAL ENDOSCOPY     VULVA SURGERY      OB History     Gravida  1   Para  1   Term  1   Preterm      AB      Living  1      SAB      IAB      Ectopic      Multiple      Live Births  1            Home Medications    Prior to Admission medications   Medication Sig Start Date End Date Taking? Authorizing Provider  nitrofurantoin , macrocrystal-monohydrate, (MACROBID ) 100 MG capsule Take 1 capsule (100 mg total) by mouth 2 (two) times daily. 10/18/23  Yes Xochitl Egle A, FNP  Cobalamin Combinations (B12 FOLATE PO) Take by mouth daily.    [provider]  ipratropium (ATROVENT ) 0.06 % nasal spray PLACE 2 SPRAYS INTO BOTH NOSTRILS 4 TIMES DAILY. 10/11/23   Neysa Reggy BIRCH, MD  loratadine (CLARITIN) 10 MG tablet Take 10 mg by mouth daily.    [provider]  metFORMIN (GLUCOPHAGE-XR) 500 MG 24 hr tablet Take 500 mg by mouth daily with breakfast. 07/16/23   [provider]  Multiple Vitamins-Minerals (PRESERVISION AREDS 2 PO) Take by mouth 2 (two) times daily.    [provider]  Nutritional Supplements (NUTRITIONAL SUPPLEMENT PO) Take by mouth daily. Dimpro    [provider]  Nutritional Supplements (NUTRITIONAL SUPPLEMENT PO) Take by mouth. MK-7 Vitamin K2    [provider]  Nutritional Supplements (NUTRITIONAL SUPPLEMENT PO) Take by mouth daily. Mag7 Magnesium    [provider]  pramipexole (MIRAPEX) 1 MG tablet Take 1 mg by mouth 3 (three) times daily. Patient taking differently: Take 1 mg by mouth daily.    [provider]  TURMERIC PO Take by mouth daily.    [provider]  Vitamins A D K (ADK PO) Take by mouth daily.    [provider]  zolpidem  (AMBIEN ) 5 MG tablet Take 5 mg by mouth at bedtime as needed for sleep. 12/03/20   [provider]    Family History Family History  Problem Relation Age of Onset   Hypertension Mother    Diabetes Mother    Heart disease Mother    Chronic Renal Failure Mother    Arrhythmia Mother        Atrial Fibrillation   Stroke Mother    Macular degeneration Father    COPD Brother    Lung disease Brother    Heart failure Maternal Grandmother    Heart attack Maternal Grandfather    Colon cancer Paternal Grandmother    Cancer Paternal Grandmother        Colon cancer   Breast cancer Neg Hx    Colon polyps Neg Hx    Esophageal cancer Neg Hx    Rectal cancer Neg Hx    Stomach cancer Neg Hx     Social History Social History   Tobacco Use   Smoking status: Former    Current packs/day: 0.00    Average packs/day: 1.5 packs/day for 28.0 years (42.0 ttl pk-yrs)    Types: Cigarettes    Start date: 02/05/1972    Quit date: 02/05/2000    Years since quitting: 23.7    Passive exposure: Past (husband  smoked)   Smokeless tobacco: Never  Vaping Use   Vaping status: Never Used  Substance Use Topics   Alcohol use: Yes    Comment: occasionally-mixed drink   Drug use: No     Allergies   Morphine   Review of Systems  Review of Systems  Genitourinary:  Positive for hematuria.   See HPI  Physical Exam Triage Vital Signs ED Triage Vitals  Encounter Vitals Group     BP 10/18/23 1558 128/78     Girls Systolic BP Percentile --      Girls Diastolic BP Percentile --      Boys Systolic BP Percentile --      Boys Diastolic BP Percentile --      Pulse Rate 10/18/23 1558 63     Resp 10/18/23 1558 20     Temp 10/18/23 1558 98.3 F (36.8 C)     Temp Source 10/18/23 1558 Oral     SpO2 10/18/23 1558 99 %     Weight --      Height --      Head Circumference --      Peak Flow --      Pain Score 10/18/23 1555 0     Pain Loc --      Pain Education --      Exclude from Growth Chart --    No data found.  Updated Vital Signs BP 128/78 (BP Location: Right Arm)   Pulse 63   Temp 98.3 F (36.8 C) (Oral)   Resp 20   SpO2 99%   Visual Acuity Right Eye Distance:   Left Eye Distance:   Bilateral Distance:    Right Eye Near:   Left Eye Near:    Bilateral Near:     Physical Exam   UC Treatments / Results  Labs (all labs ordered are listed, but only abnormal results are displayed) Labs Reviewed  POCT URINALYSIS DIP (MANUAL ENTRY) - Abnormal; Notable for the following components:      Result Value   Color, UA other (*)    Clarity, UA cloudy (*)    Blood, UA large (*)    Leukocytes, UA Small (1+) (*)    All other components within normal limits  URINE CULTURE    EKG   Radiology No results found.  Procedures Procedures (including critical care time)  Medications Ordered in UC Medications - No data to display  Initial Impression / Assessment and Plan / UC Course  I have reviewed the triage vital signs and the nursing notes.  Pertinent labs & imaging results  that were available during my care of the patient were reviewed by me and considered in my medical decision making (see chart for details).     Hematuria-urine with large blood, small leuks and cloudy.  Will culture.  Will treat pending culture with Macrobid .  Recommend push fluids.  Will call with any changes if needed.  Follow-up as needed Final Clinical Impressions(s) / UC Diagnoses   Final diagnoses:  Hematuria, unspecified type     Discharge Instructions      Your urine did have some bacteria and blood.  I am culturing the urine.  Will go ahead and treat for urinary tract infection at this time.  We will call you with any changes if needed when we get your culture results.  Make sure you are drinking plenty of fluids.  Follow-up as needed   ED Prescriptions     Medication Sig Dispense Auth. Provider   nitrofurantoin , macrocrystal-monohydrate, (MACROBID ) 100 MG capsule Take 1 capsule (100 mg total) by mouth 2 (two) times daily. 10 capsule Adah Wilbert LABOR, FNP      PDMP not reviewed this encounter.   Adah Wilbert LABOR, FNP 10/18/23 [phone]

## 2023-10-20 LAB — URINE CULTURE: Culture: 20000 — AB

## 2023-10-22 ENCOUNTER — Ambulatory Visit (HOSPITAL_COMMUNITY): Payer: Self-pay

## 2023-10-22 MED ORDER — AMOXICILLIN-POT CLAVULANATE 875-125 MG PO TABS: 1.0000 | ORAL_TABLET | Freq: Two times a day (BID) | ORAL | 0 refills | Status: AC

## 2023-10-22 NOTE — Telephone Encounter (Signed)
 Culture did not reveal a significant amount of CFU's however because it is vancomycin-resistant do recommend treatment with 7-day course of Augmentin .  Please advise patient to discontinue Macrobid  at this time.

## 2024-01-28 NOTE — Progress Notes (Unsigned)
 HPI female former smoker followed for COPD, rhinitis, complicated by GERD/Barrett's Her brother Daril Lobstein had been followed here for chronic respiratory failure with congestive heart failure and history of pulmonary emboli and bullous emphysema. She thought he had an alpha-1 antitrypsin abnormality  a1AT 02/19/13- was normal MM PFT 02/18/13-minimal obstructive airways disease, no response to bronchodilator, FVC 4.13/114%, FEV1 2.32/82%, ratio 0.56, FEF 25-75% 1.05/41%, DLCO 70%, TLC 118% Office Spirometry 08/13/2017-mild obstruction.  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 COPD symptomatic mainly with DOE, so Spiriva may be better choice, if any bronchodilator needed. Discussed the use of AI scribe software for clinical note transcription with the patient, who gave verbal consent to proceed.  History of Present Illness   Cheyenne Gallegos is a 68 year old female with emphysema who presents for follow-up of her pulmonary condition.  She experienced throat irritation with Trelegy, which improved after discontinuation, though some symptoms persist. Trelegy initially  improved her stair-climbing ability, but the effect was not sustained. Her pulmonary function test in January showed a mild emphysema pattern. Silent reflux may contribute to her throat irritation. Her last chest x-ray in November was stable. Her condition has remained mild over the past five years. She is concerned about the potential need for oxygen in the future. She maintains physical activity by using stairs at work, which benefits her stamina and leg strength.     Assessment and Plan:    Emphysema Mild emphysema stable since last pulmonary function test. Exertional dyspnea without progression. No oxygen therapy needed. - Encourage regular physical activity to maintain stamina and leg strength. - Avoid powder-based inhalers due to throat irritation. - Consider alternative inhalers if symptoms worsen. - Annual chest X-ray in November.  Gastroesophageal reflux disease (GERD) Silent GERD contributing to throat irritation, managed with lifestyle modifications. - Continue lifestyle modifications.      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 Discussed the use of AI scribe software for clinical note transcription with the patient, who gave verbal consent to proceed.  History of Present Illness   Cheyenne Gallegos is a 68 year old female with COPD who presents with persistent phlegm and cough.  She experiences persistent phlegm and cough, with the phlegm being clear and thick, requiring effort to expel. The cough and phlegm have worsened, affecting her voice, which becomes hoarse. She previously used a Trelegy inhaler, discontinued due to throat irritation. Despite this, phlegm and cough persist. Mucinex suggested. We will see how she likes Breztri . . Nasal drainage increases  during seasonal changes, particularly in  fall and spring, with a noted increase over the past two to three weeks. Atrovent  nasal spray usually helps. Declines flu vax.     CXR 02/05/23 IMPRESSION: COPD without acute cardiopulmonary disease.  Assessment and Plan:    COPD mixed type Chronic obstructive pulmonary disease with chronic cough and sputum production COPD with persistent cough and clear sputum, low likelihood of bacterial infection. Symptoms worsened, causing discomfort, voice changes, and thick mucus. Previous Trelegy inhaler discontinued due to throat irritation, possibly thrush. Current symptoms may indicate bronchitis or airway irritation. - Order chest x-ray to rule out pulmonary changes. - Prescribe Breztri  inhaler as a sample for trial use to assess efficacy in symptom management. - Recommend Mucinex and increased water intake to manage mucus viscosity.  Seasonal allergic rhinitis Seasonal allergic rhinitis with increased nasal drainage during fall. Nasal spray effective except during seasonal changes. Nasal drainage may contribute to airway irritation and mucus production. - Continue nasal spray as needed for symptom control.     ROS-see HPI + = positive Constitutional:   No-   weight loss, night sweats, fevers, chills, fatigue, lassitude. HEENT:   No-  headaches, difficulty swallowing, tooth/dental problems, sore throat,       No-  sneezing, itching, ear ache,  nasal congestion, post nasal drip,  CV:  No-   chest pain, orthopnea, PND, swelling in lower extremities, anasarca, dizziness, palpitations Resp: +Per HPI- shortness of breath with exertion or at rest.              No-   productive cough,   +non-productive cough,  No- coughing up of blood.              No-   change in color of mucus.   wheezing.   Skin: No-   rash or lesions. GI:  + heartburn, indigestion, No-abdominal pain, nausea, vomiting,  GU:  MS:  No-   joint pain or swelling.   Neuro-     nothing  unusual Psych:  No- change in mood or affect. No depression or anxiety.  No memory loss.  OBJ General- Alert, Oriented, Affect-appropriate, Distress- none acute; well-appearing Skin- rash-none, lesions- none, excoriation- none Lymphadenopathy- none Head- atraumatic            Eyes- Gross vision intact, PERRLA, conjunctivae clear secretions            Ears- Hearing, canals-normal            Nose-clear, no-Septal dev,  polyps- none,  erosion, perforation             Throat- Mallampati II , mucosa clear , drainage- none, tonsils- atrophic Neck- flexible , trachea midline, no stridor , thyroid nl, carotid no bruit Chest - symmetrical excursion , unlabored           Heart/CV- RRR , no murmur , no gallop  , no rub, nl s1 s2                           - JVD- none , edema- none, stasis changes- none, varices- none           Lung- clear to P&A/ distant, wheeze- none, cough- none , dullness-none, rub- none           Chest wall-  Abd-  Br/ Gen/ Rectal- Not done, not indicated Extrem- cyanosis- none, clubbing, none, atrophy- none, strength- nl Neuro- grossly intact to observation

## 2024-01-29 ENCOUNTER — Encounter: Payer: Self-pay | Admitting: Internal Medicine

## 2024-01-29 ENCOUNTER — Ambulatory Visit: Admitting: Internal Medicine

## 2024-01-29 ENCOUNTER — Ambulatory Visit

## 2024-01-29 VITALS — BP 132/70 | HR 65 | Temp 97.9°F | Ht 66.0 in | Wt 142.0 lb

## 2024-01-29 DIAGNOSIS — J449 Chronic obstructive pulmonary disease, unspecified: Secondary | ICD-10-CM | POA: Diagnosis not present

## 2024-01-29 NOTE — Patient Instructions (Signed)
 Order- CXR   dx COPD mixed type  Order- sample x 1 Breztri  inhaler   inhale 2 puffs then rinse mouth, twice daily  You can try otc Mucinex and drinking more water, to see if that makes mucus thinner and easier to cough out.

## 2024-02-01 ENCOUNTER — Encounter: Payer: Self-pay | Admitting: Internal Medicine

## 2024-02-03 ENCOUNTER — Ambulatory Visit: Payer: Self-pay | Admitting: Internal Medicine

## 2024-02-05 ENCOUNTER — Ambulatory Visit: Payer: Medicare HMO | Admitting: Internal Medicine

## 2024-04-16 ENCOUNTER — Encounter: Payer: Self-pay | Admitting: Pulmonary Disease

## 2024-04-16 ENCOUNTER — Ambulatory Visit: Admitting: Pulmonary Disease

## 2024-04-16 VITALS — BP 136/68 | HR 78 | Ht 66.0 in | Wt 140.4 lb

## 2024-04-16 DIAGNOSIS — K219 Gastro-esophageal reflux disease without esophagitis: Secondary | ICD-10-CM | POA: Diagnosis not present

## 2024-04-16 DIAGNOSIS — R0609 Other forms of dyspnea: Secondary | ICD-10-CM

## 2024-04-16 DIAGNOSIS — R053 Chronic cough: Secondary | ICD-10-CM

## 2024-04-16 DIAGNOSIS — Z87891 Personal history of nicotine dependence: Secondary | ICD-10-CM | POA: Diagnosis not present

## 2024-04-16 DIAGNOSIS — J310 Chronic rhinitis: Secondary | ICD-10-CM

## 2024-04-16 DIAGNOSIS — J449 Chronic obstructive pulmonary disease, unspecified: Secondary | ICD-10-CM

## 2024-04-16 MED ORDER — IPRATROPIUM BROMIDE 0.06 % NA SOLN: 2.0000 | Freq: Four times a day (QID) | NASAL | 3 refills | Status: AC

## 2024-04-16 NOTE — Assessment & Plan Note (Addendum)
 Cheyenne Gallegos

## 2024-04-16 NOTE — Assessment & Plan Note (Addendum)
 SABRA

## 2024-04-16 NOTE — Assessment & Plan Note (Signed)
 Cheyenne Gallegos

## 2024-04-16 NOTE — Progress Notes (Unsigned)
 "  New Patient Pulmonology Office Visit   Subjective:  Patient ID: Cheyenne Gallegos, female    DOB: Apr 01, 1955  MRN: [id number]  Referred by: Silver Lamar LABOR, MD  CC:  Chief Complaint  Patient presents with   Medical Management of Chronic Issues    TOC- pt states no new concerns     Discussed the use of AI scribe software for clinical note transcription with the patient, who gave verbal consent to proceed.  History of Present Illness Cheyenne Gallegos is a 69 year old female with COPD who returns for follow-up in the pulmonary clinic.  She has COPD with a 42 pack-year smoking history and quit in 2001. She had recent pulmonary function testing and a chest X-ray. She tried Breztri  two puffs twice daily after her last visit without clear benefit and is not using any other inhalers.  For about 1 to 2 months she has had increased throat clearing and difficulty talking from phlegm, worse in the mornings. Mucinex twice daily improves these symptoms, and if she misses doses she has more phlegm and trouble talking.  She has GERD and Barrett's esophagus and takes Prevacid  30 mg daily. She has episodic nocturnal chest pain she associates with heartburn, usually relieved by water. Two nights ago she had more persistent chest pain that recurred when she lay down and improved with head elevation. Barbecue sauce and certain foods trigger her heartburn.  She has a dry, nonproductive cough noticed by her husband. She uses a nasal spray chronically for rhinitis with good control of nasal drainage and no significant sinus congestion or drainage. Testing for alpha-1 antitrypsin deficiency was negative.        ROS  Allergies: Morphine Current Medications[1] Past Medical History:  Diagnosis Date   ADD (attention deficit disorder with hyperactivity)    Allergy    seasonal   Anemia    PAST HX    Arthritis    thumbs   Barretts esophagus    Condyloma    vulvar   COPD (chronic obstructive  pulmonary disease) (HCC)    mild    Emphysema of lung (HCC)    GERD (gastroesophageal reflux disease)    Hiatal hernia    Hypercholesteremia    Insomnia    Macular degeneration    Meniere disease    Nausea    Normal spontaneous vaginal delivery    Osteopenia    Tachycardia    VIN I (vulvar intraepithelial neoplasia I)    left labia   VIN III (vulvar intraepithelial neoplasia III)    Vitamin D  deficiency    Past Surgical History:  Procedure Laterality Date   BREAST BIOPSY  [phone]   BREAST EXCISIONAL BIOPSY Right 1979   BREAST EXCISIONAL BIOPSY Right 1980   BREAST EXCISIONAL BIOPSY Right 1983   BREAST EXCISIONAL BIOPSY Right 1986   CARDIOVASCULAR STRESS TEST  12/17/2009   Images showed normal pattern of perfusion in all regions. EKG is negative for ischemia.   co2 laser of labia     COLONOSCOPY  01/05/2017   Small internal hemorrhoids. Otherwise normal colonoscopy.    ESOPHAGOGASTRODUODENOSCOPY  08/20/2015   Barretts esophagus. Gastric poylp.   FOOT SURGERY  2001   HAND SURGERY Right Apr 28 2014   TOTAL VAGINAL HYSTERECTOMY  1995   TUBAL LIGATION     UPPER GASTROINTESTINAL ENDOSCOPY     VULVA SURGERY     Family History  Problem Relation Age of Onset   Hypertension Mother  Diabetes Mother    Heart disease Mother    Chronic Renal Failure Mother    Arrhythmia Mother        Atrial Fibrillation   Stroke Mother    Macular degeneration Father    COPD Brother    Lung disease Brother    Heart failure Maternal Grandmother    Heart attack Maternal Grandfather    Colon cancer Paternal Grandmother    Cancer Paternal Grandmother        Colon cancer   Breast cancer Neg Hx    Colon polyps Neg Hx    Esophageal cancer Neg Hx    Rectal cancer Neg Hx    Stomach cancer Neg Hx    Social History   Socioeconomic History   Marital status: Married    Spouse name: Not on file   Number of children: Not on file   Years of education: Not on file   Highest education level:  Not on file  Occupational History   Occupation: superivsor at [employer]  Tobacco Use   Smoking status: Former    Current packs/day: 0.00    Average packs/day: 1.5 packs/day for 28.0 years (42.0 ttl pk-yrs)    Types: Cigarettes    Start date: 02/05/1972    Quit date: 02/05/2000    Years since quitting: 24.2    Passive exposure: Past (husband smoked)   Smokeless tobacco: Never  Vaping Use   Vaping status: Never Used  Substance and Sexual Activity   Alcohol use: Yes    Comment: occasionally-mixed drink   Drug use: No   Sexual activity: Yes    Partners: Male    Birth control/protection: Surgical    Comment: 1st intercourse 69 yo-Fewer than 5 partners, hysterectomy  Other Topics Concern   Not on file  Social History Narrative   Not on file   Social Drivers of Health   Tobacco Use: Medium Risk (04/16/2024)   Patient History    Smoking Tobacco Use: Former    Smokeless Tobacco Use: Never    Passive Exposure: Past  Physicist, Medical Strain: Not on file  Food Insecurity: Low Risk (07/17/2023)   Received from [facility]   Epic    Within the past 12 months, you worried that your food would run out before you got money to buy more: Never true    Within the past 12 months, the food you bought just didn't last and you didn't have money to get more. : Never true  Transportation Needs: No Transportation Needs (07/17/2023)   Received from [facility]    In the past 12 months, has lack of reliable transportation kept you from medical appointments, meetings, work or from getting things needed for daily living? : No  Physical Activity: Not on file  Stress: Not on file  Social Connections: Not on file  Intimate Partner Violence: Not on file  Depression (EYV7-0): Not on file  Alcohol Screen: Not on file  Housing: Low Risk (07/17/2023)   Received from [facility]   Epic    What is your living situation today?: I have a steady place to live    Think about the place you  live. Do you have problems with any of the following? Choose all that apply:: None/None on this list  Utilities: Low Risk (07/17/2023)   Received from [facility]   Utilities    In the past 12 months has the electric, gas, oil, or water company threatened to shut off services  in your home? : No  Health Literacy: Not on file       Objective:  BP 136/68   Pulse 78   Ht 5' 6 (1.676 m) Comment: per pt  Wt 140 lb 6.4 oz (63.7 kg)   SpO2 99%   BMI 22.66 kg/m    Physical Exam Constitutional:      General: She is not in acute distress.    Appearance: Normal appearance.  Eyes:     General: No scleral icterus.    Conjunctiva/sclera: Conjunctivae normal.  Cardiovascular:     Rate and Rhythm: Normal rate and regular rhythm.  Pulmonary:     Breath sounds: No wheezing, rhonchi or rales.  Musculoskeletal:     Right lower leg: No edema.     Left lower leg: No edema.  Skin:    General: Skin is warm and dry.  Neurological:     General: No focal deficit present.     Diagnostic Review:  Last CBC Lab Results  Component Value Date   WBC 5.6 06/10/2022   HGB 13.2 06/10/2022   HCT 40.2 06/10/2022   MCV 95.9 06/10/2022   MCH 31.5 06/10/2022   RDW 12.5 06/10/2022   PLT 274 06/10/2022   Last metabolic panel Lab Results  Component Value Date   GLUCOSE 102 (H) 06/10/2022   NA 138 06/10/2022   K 4.4 06/10/2022   CL 105 06/10/2022   CO2 26 06/10/2022   BUN 20 06/10/2022   CREATININE 0.87 06/10/2022   GFRNONAA >60 06/10/2022   CALCIUM  8.9 06/10/2022   PROT 6.0 (L) 12/12/2018   ALBUMIN 3.9 12/20/2015   BILITOT 0.4 12/12/2018   ALKPHOS 62 12/20/2015   AST 21 12/12/2018   ALT 19 12/12/2018   ANIONGAP 7 06/10/2022   CXR 01/29/24 Lungs are mildly hyperexpanded. Mild interstitial prominence with peripheral lucency. No pleural effusion or pneumothorax. No discrete lobar infiltrate.     Assessment & Plan:   Assessment & Plan COPD mixed type (HCC)     Chronic cough      DOE (dyspnea on exertion)     Chronic rhinitis     Gastroesophageal reflux disease without esophagitis      Assessment and Plan Assessment & Plan Chronic obstructive pulmonary disease (COPD) COPD with mild obstruction and hyperinflation.  - Continue current management without inhaler therapy. - Monitor for symptoms such as shortness of breath, wheezing, or chest tightness. - Encouraged staying active - Advised avoiding secondhand smoke and vaping.  Chronic cough Intermittent dry cough, possibly related to reflux or environmental factors. - Continue Mucinex as needed for mucus management. - Consider using a wedge pillow to reduce reflux-related cough. - Monitor for changes in cough frequency or severity.  Chronic rhinitis Nasal spray effectively controls symptoms. - Continue current nasal spray regimen with ipratropium  Gastroesophageal reflux disease (GERD) GERD with Barrett's esophagus. Symptoms suggestive of reflux rather than cardiac origin. Reflux may contribute to lung symptoms through aspiration. - Continue Prevacid  once daily. - Consider using a wedge pillow to reduce reflux symptoms. - Discuss with GI doctor about potential addition of nighttime medication if symptoms persist. - Avoid dietary triggers that exacerbate reflux.      Return in about 1 year (around 04/16/2025) for f/u visit Dr. Kara.   Dorn KATHEE Kara, MD     [1]  Current Outpatient Medications:    Cobalamin Combinations (B12 FOLATE PO), Take by mouth daily., Disp: , Rfl:    lansoprazole  (PREVACID ) 30 MG capsule, Take  30 mg by mouth daily at 12 noon., Disp: , Rfl:    loratadine (CLARITIN) 10 MG tablet, Take 10 mg by mouth daily., Disp: , Rfl:    metFORMIN (GLUCOPHAGE-XR) 500 MG 24 hr tablet, Take 500 mg by mouth daily with breakfast., Disp: , Rfl:    Multiple Vitamins-Minerals (PRESERVISION AREDS 2 PO), Take by mouth 2 (two) times daily., Disp: , Rfl:    Nutritional Supplements  (NUTRITIONAL SUPPLEMENT PO), Take by mouth daily. Dimpro, Disp: , Rfl:    Nutritional Supplements (NUTRITIONAL SUPPLEMENT PO), Take by mouth. MK-7 Vitamin K2, Disp: , Rfl:    Nutritional Supplements (NUTRITIONAL SUPPLEMENT PO), Take by mouth daily. Mag7 Magnesium, Disp: , Rfl:    pramipexole (MIRAPEX) 1 MG tablet, Take 1 mg by mouth 3 (three) times daily., Disp: , Rfl:    TURMERIC PO, Take by mouth daily., Disp: , Rfl:    Vitamins A D K (ADK PO), Take by mouth daily., Disp: , Rfl:    zolpidem  (AMBIEN ) 5 MG tablet, Take 5 mg by mouth at bedtime as needed for sleep., Disp: , Rfl:    ipratropium (ATROVENT ) 0.06 % nasal spray, Place 2 sprays into the nose 4 (four) times daily., Disp: 15 mL, Rfl: 3  "

## 2024-04-16 NOTE — Patient Instructions (Addendum)
 Continue ipratropium nasal spray as needed  Ok to continue mucinex as needed for chest congestion  We will consider further inhaler trials in the future based on your symptoms.  Follow up in 1 year, call sooner

## 2024-04-17 ENCOUNTER — Encounter: Payer: Self-pay | Admitting: Pulmonary Disease
